# Patient Record
Sex: Male | Born: 1980 | Race: White | Hispanic: No | Marital: Married | Smoking: Former smoker
Health system: Southern US, Community
[De-identification: ages and names within clinical notes are randomized; demographics above are authoritative.]

## PROBLEM LIST (undated history)

## (undated) DIAGNOSIS — Z789 Other specified health status: Secondary | ICD-10-CM

## (undated) HISTORY — PX: HERNIA REPAIR: SHX51

---

## 2000-12-30 ENCOUNTER — Emergency Department (HOSPITAL_COMMUNITY): Admission: EM | Admit: 2000-12-30 | Discharge: 2000-12-30 | Payer: Self-pay | Admitting: Emergency Medicine

## 2001-03-31 ENCOUNTER — Emergency Department (HOSPITAL_COMMUNITY): Admission: EM | Admit: 2001-03-31 | Discharge: 2001-03-31 | Payer: Self-pay | Admitting: *Deleted

## 2001-03-31 ENCOUNTER — Encounter: Payer: Self-pay | Admitting: *Deleted

## 2002-01-10 ENCOUNTER — Emergency Department (HOSPITAL_COMMUNITY): Admission: EM | Admit: 2002-01-10 | Discharge: 2002-01-10 | Payer: Self-pay | Admitting: Emergency Medicine

## 2003-01-19 ENCOUNTER — Emergency Department (HOSPITAL_COMMUNITY): Admission: EM | Admit: 2003-01-19 | Discharge: 2003-01-19 | Payer: Self-pay | Admitting: *Deleted

## 2004-01-02 ENCOUNTER — Emergency Department (HOSPITAL_COMMUNITY): Admission: EM | Admit: 2004-01-02 | Discharge: 2004-01-02 | Payer: Self-pay | Admitting: Emergency Medicine

## 2004-01-31 ENCOUNTER — Emergency Department (HOSPITAL_COMMUNITY): Admission: EM | Admit: 2004-01-31 | Discharge: 2004-01-31 | Payer: Self-pay | Admitting: Emergency Medicine

## 2004-03-09 ENCOUNTER — Emergency Department (HOSPITAL_COMMUNITY): Admission: EM | Admit: 2004-03-09 | Discharge: 2004-03-09 | Payer: Self-pay | Admitting: Emergency Medicine

## 2004-11-07 ENCOUNTER — Emergency Department (HOSPITAL_COMMUNITY): Admission: EM | Admit: 2004-11-07 | Discharge: 2004-11-07 | Payer: Self-pay | Admitting: Emergency Medicine

## 2006-06-16 ENCOUNTER — Emergency Department (HOSPITAL_COMMUNITY): Admission: EM | Admit: 2006-06-16 | Discharge: 2006-06-16 | Payer: Self-pay | Admitting: Emergency Medicine

## 2007-03-05 ENCOUNTER — Emergency Department (HOSPITAL_COMMUNITY): Admission: EM | Admit: 2007-03-05 | Discharge: 2007-03-05 | Payer: Self-pay | Admitting: Emergency Medicine

## 2008-01-05 ENCOUNTER — Emergency Department (HOSPITAL_COMMUNITY): Admission: EM | Admit: 2008-01-05 | Discharge: 2008-01-05 | Payer: Self-pay | Admitting: Emergency Medicine

## 2008-03-02 ENCOUNTER — Emergency Department (HOSPITAL_COMMUNITY): Admission: EM | Admit: 2008-03-02 | Discharge: 2008-03-02 | Payer: Self-pay | Admitting: Emergency Medicine

## 2008-03-27 ENCOUNTER — Emergency Department (HOSPITAL_COMMUNITY): Admission: EM | Admit: 2008-03-27 | Discharge: 2008-03-27 | Payer: Self-pay | Admitting: Emergency Medicine

## 2008-05-31 ENCOUNTER — Emergency Department (HOSPITAL_COMMUNITY): Admission: EM | Admit: 2008-05-31 | Discharge: 2008-05-31 | Payer: Self-pay | Admitting: Emergency Medicine

## 2008-06-21 ENCOUNTER — Emergency Department (HOSPITAL_COMMUNITY): Admission: EM | Admit: 2008-06-21 | Discharge: 2008-06-21 | Payer: Self-pay | Admitting: Emergency Medicine

## 2008-09-12 ENCOUNTER — Emergency Department (HOSPITAL_COMMUNITY): Admission: EM | Admit: 2008-09-12 | Discharge: 2008-09-12 | Payer: Self-pay | Admitting: Emergency Medicine

## 2008-10-27 ENCOUNTER — Emergency Department (HOSPITAL_COMMUNITY): Admission: EM | Admit: 2008-10-27 | Discharge: 2008-10-27 | Payer: Self-pay | Admitting: Psychiatry

## 2008-11-22 ENCOUNTER — Emergency Department (HOSPITAL_COMMUNITY): Admission: EM | Admit: 2008-11-22 | Discharge: 2008-11-22 | Payer: Self-pay | Admitting: Emergency Medicine

## 2008-12-03 ENCOUNTER — Emergency Department (HOSPITAL_COMMUNITY): Admission: EM | Admit: 2008-12-03 | Discharge: 2008-12-03 | Payer: Self-pay | Admitting: Emergency Medicine

## 2009-02-02 ENCOUNTER — Emergency Department (HOSPITAL_COMMUNITY): Admission: EM | Admit: 2009-02-02 | Discharge: 2009-02-02 | Payer: Self-pay | Admitting: Emergency Medicine

## 2009-03-13 ENCOUNTER — Emergency Department (HOSPITAL_COMMUNITY): Admission: EM | Admit: 2009-03-13 | Discharge: 2009-03-13 | Payer: Self-pay | Admitting: Emergency Medicine

## 2009-04-18 ENCOUNTER — Emergency Department (HOSPITAL_COMMUNITY): Admission: EM | Admit: 2009-04-18 | Discharge: 2009-04-18 | Payer: Self-pay | Admitting: Emergency Medicine

## 2009-07-07 ENCOUNTER — Emergency Department (HOSPITAL_COMMUNITY): Admission: EM | Admit: 2009-07-07 | Discharge: 2009-07-07 | Payer: Self-pay | Admitting: Emergency Medicine

## 2009-08-30 ENCOUNTER — Emergency Department (HOSPITAL_COMMUNITY): Admission: EM | Admit: 2009-08-30 | Discharge: 2009-08-30 | Payer: Self-pay | Admitting: Emergency Medicine

## 2009-09-26 ENCOUNTER — Emergency Department (HOSPITAL_COMMUNITY): Admission: EM | Admit: 2009-09-26 | Discharge: 2009-09-26 | Payer: Self-pay | Admitting: Emergency Medicine

## 2009-11-30 ENCOUNTER — Emergency Department (HOSPITAL_COMMUNITY): Admission: EM | Admit: 2009-11-30 | Discharge: 2009-11-30 | Payer: Self-pay | Admitting: Emergency Medicine

## 2010-07-29 ENCOUNTER — Emergency Department (HOSPITAL_COMMUNITY)
Admission: EM | Admit: 2010-07-29 | Discharge: 2010-07-29 | Disposition: A | Discharge status: Home / Self Care | Payer: Self-pay | Attending: Emergency Medicine | Admitting: Emergency Medicine

## 2010-07-29 DIAGNOSIS — R220 Localized swelling, mass and lump, head: Secondary | ICD-10-CM | POA: Insufficient documentation

## 2010-07-29 DIAGNOSIS — R221 Localized swelling, mass and lump, neck: Secondary | ICD-10-CM | POA: Insufficient documentation

## 2010-07-29 DIAGNOSIS — R51 Headache: Secondary | ICD-10-CM | POA: Insufficient documentation

## 2010-07-29 DIAGNOSIS — IMO0002 Reserved for concepts with insufficient information to code with codable children: Secondary | ICD-10-CM | POA: Insufficient documentation

## 2010-07-30 ENCOUNTER — Emergency Department (HOSPITAL_COMMUNITY): Payer: Self-pay

## 2010-07-30 ENCOUNTER — Emergency Department (HOSPITAL_COMMUNITY)
Admission: EM | Admit: 2010-07-30 | Discharge: 2010-07-30 | Disposition: A | Discharge status: Home / Self Care | Payer: Self-pay | Attending: Emergency Medicine | Admitting: Emergency Medicine

## 2010-07-30 DIAGNOSIS — Y92009 Unspecified place in unspecified non-institutional (private) residence as the place of occurrence of the external cause: Secondary | ICD-10-CM | POA: Insufficient documentation

## 2010-07-30 DIAGNOSIS — S022XXA Fracture of nasal bones, initial encounter for closed fracture: Secondary | ICD-10-CM | POA: Insufficient documentation

## 2010-07-30 DIAGNOSIS — R220 Localized swelling, mass and lump, head: Secondary | ICD-10-CM | POA: Insufficient documentation

## 2010-07-30 DIAGNOSIS — F17200 Nicotine dependence, unspecified, uncomplicated: Secondary | ICD-10-CM | POA: Insufficient documentation

## 2010-11-11 ENCOUNTER — Emergency Department (HOSPITAL_COMMUNITY): Payer: Self-pay

## 2010-11-11 ENCOUNTER — Emergency Department (HOSPITAL_COMMUNITY)
Admission: EM | Admit: 2010-11-11 | Discharge: 2010-11-11 | Disposition: A | Discharge status: Home / Self Care | Payer: Self-pay | Attending: Emergency Medicine | Admitting: Emergency Medicine

## 2010-11-11 ENCOUNTER — Encounter: Payer: Self-pay | Admitting: *Deleted

## 2010-11-11 DIAGNOSIS — S6000XA Contusion of unspecified finger without damage to nail, initial encounter: Secondary | ICD-10-CM | POA: Insufficient documentation

## 2010-11-11 DIAGNOSIS — S60219A Contusion of unspecified wrist, initial encounter: Secondary | ICD-10-CM | POA: Insufficient documentation

## 2010-11-11 DIAGNOSIS — F17200 Nicotine dependence, unspecified, uncomplicated: Secondary | ICD-10-CM | POA: Insufficient documentation

## 2010-11-11 DIAGNOSIS — M79609 Pain in unspecified limb: Secondary | ICD-10-CM | POA: Insufficient documentation

## 2010-11-11 MED ORDER — HYDROCODONE-ACETAMINOPHEN 5-325 MG PO TABS: ORAL_TABLET | ORAL | Status: DC

## 2010-11-11 NOTE — ED Notes (Signed)
Pt states was working on car and was using 10 ton press to push out car part and wheel barring fell on right hand/wrist.  C/o increased pain/swelling.  Minimal swelling noted in triage.  Right radial pulse present, CMS intact in triage.  ROM limited due to pain.

## 2010-11-11 NOTE — ED Provider Notes (Signed)
History     Chief Complaint  Patient presents with  . Hand Pain   HPI Comments: Injured hand while working on a car.  Patient is a 30 y.o. male presenting with hand pain. The history is provided by the patient. No language interpreter was used.  Hand Pain This is a new (3 days ago) problem. The current episode started today. The problem occurs constantly. The problem has been gradually worsening. Associated symptoms include joint swelling. The symptoms are aggravated by bending. He has tried nothing for the symptoms. The treatment provided no relief.    History reviewed. No pertinent past medical history.  Past Surgical History  Procedure Date  . Hernia repair     History reviewed. No pertinent family history.  History  Substance Use Topics  . Smoking status: Current Everyday Smoker  . Smokeless tobacco: Not on file  . Alcohol Use: No      Review of Systems  Musculoskeletal: Positive for joint swelling.       Dorsal wrist pain and R 3rd finger  Pain swelling.    Physical Exam  BP 112/84  Pulse 83  Temp(Src) 98.3 F (36.8 C) (Oral)  Resp 20  Ht 5' 7.5" (1.715 m)  Wt 190 lb (86.183 kg)  BMI 29.32 kg/m2  SpO2 100%  Physical Exam  Nursing note and vitals reviewed. Constitutional: He is oriented to person, place, and time. Vital signs are normal. He appears well-developed and well-nourished. No distress.  HENT:  Head: Normocephalic and atraumatic.  Right Ear: External ear normal.  Left Ear: External ear normal.  Nose: Nose normal.  Mouth/Throat: No oropharyngeal exudate.  Eyes: Conjunctivae and EOM are normal. Pupils are equal, round, and reactive to light. Right eye exhibits no discharge. Left eye exhibits no discharge. No scleral icterus.  Neck: Normal range of motion. Neck supple. No JVD present. No tracheal deviation present. No thyromegaly present.  Cardiovascular: Normal rate, regular rhythm, normal heart sounds, intact distal pulses and normal pulses.   Exam reveals no gallop and no friction rub.   No murmur heard. Pulmonary/Chest: Effort normal and breath sounds normal. No stridor. No respiratory distress. He has no wheezes. He has no rales. He exhibits no tenderness.  Abdominal: Soft. Normal appearance and bowel sounds are normal. He exhibits no distension and no mass. There is no tenderness. There is no rebound and no guarding.  Musculoskeletal: He exhibits tenderness. He exhibits no edema.       Right shoulder: He exhibits decreased range of motion, tenderness, bony tenderness, swelling and pain. He exhibits no crepitus, no deformity, no laceration, no spasm, normal pulse and normal strength.       Arms: Lymphadenopathy:    He has no cervical adenopathy.  Neurological: He is alert and oriented to person, place, and time. He has normal reflexes. No cranial nerve deficit. Coordination normal. GCS eye subscore is 4. GCS verbal subscore is 5. GCS motor subscore is 6.  Reflex Scores:      Tricep reflexes are 2+ on the right side and 2+ on the left side.      Bicep reflexes are 2+ on the right side and 2+ on the left side.      Brachioradialis reflexes are 2+ on the right side and 2+ on the left side.      Patellar reflexes are 2+ on the right side and 2+ on the left side.      Achilles reflexes are 2+ on the right side and 2+  on the left side. Skin: Skin is warm and dry. No rash noted. He is not diaphoretic.  Psychiatric: He has a normal mood and affect. His speech is normal and behavior is normal. Judgment and thought content normal. Cognition and memory are normal.    ED Course  Procedures  MDM       Worthy Rancher, PA 11/11/10 1940  Worthy Rancher, PA 12/27/10 1610  Worthy Rancher, PA 12/27/10 [id number]  Worthy Rancher, PA 02/12/11 1315

## 2010-12-18 NOTE — ED Provider Notes (Signed)
History     CSN: [id number] Arrival date & time: 11/11/2010  4:54 PM  Chief Complaint  Patient presents with  . Hand Pain   HPI  History reviewed. No pertinent past medical history.  Past Surgical History  Procedure Date  . Hernia repair     History reviewed. No pertinent family history.  History  Substance Use Topics  . Smoking status: Current Everyday Smoker  . Smokeless tobacco: Not on file  . Alcohol Use: No      Review of Systems  Physical Exam  BP 112/84  Pulse 83  Temp(Src) 98.3 F (36.8 C) (Oral)  Resp 20  Ht 5' 7.5" (1.715 m)  Wt 190 lb (86.183 kg)  BMI 29.32 kg/m2  SpO2 100%  Physical Exam  ED Course  Procedures  MDM     Medical screening examination/treatment/procedure(s) were performed by non-physician practitioner and as supervising physician I was immediately available for consultation/collaboration.  Donnetta Hutching, MD 12/18/10 [phone]

## 2011-01-05 ENCOUNTER — Emergency Department (HOSPITAL_COMMUNITY): Payer: Self-pay

## 2011-01-05 ENCOUNTER — Emergency Department (HOSPITAL_COMMUNITY)
Admission: EM | Admit: 2011-01-05 | Discharge: 2011-01-05 | Disposition: A | Discharge status: Home / Self Care | Payer: Self-pay | Attending: Emergency Medicine | Admitting: Emergency Medicine

## 2011-01-05 ENCOUNTER — Encounter (HOSPITAL_COMMUNITY): Payer: Self-pay | Admitting: *Deleted

## 2011-01-05 DIAGNOSIS — F17200 Nicotine dependence, unspecified, uncomplicated: Secondary | ICD-10-CM | POA: Insufficient documentation

## 2011-01-05 DIAGNOSIS — IMO0002 Reserved for concepts with insufficient information to code with codable children: Secondary | ICD-10-CM | POA: Insufficient documentation

## 2011-01-05 DIAGNOSIS — M25449 Effusion, unspecified hand: Secondary | ICD-10-CM | POA: Insufficient documentation

## 2011-01-05 DIAGNOSIS — S60229A Contusion of unspecified hand, initial encounter: Secondary | ICD-10-CM | POA: Insufficient documentation

## 2011-01-05 DIAGNOSIS — Y9269 Other specified industrial and construction area as the place of occurrence of the external cause: Secondary | ICD-10-CM | POA: Insufficient documentation

## 2011-01-05 MED ORDER — IBUPROFEN 800 MG PO TABS
800.0000 mg | ORAL_TABLET | Freq: Once | ORAL | Status: AC
  Administered 2011-01-05: 800 mg via ORAL
  Filled 2011-01-05: qty 1

## 2011-01-05 MED ORDER — HYDROCODONE-ACETAMINOPHEN 5-325 MG PO TABS: ORAL_TABLET | ORAL | Status: DC

## 2011-01-05 MED ORDER — HYDROCODONE-ACETAMINOPHEN 5-325 MG PO TABS
1.0000 | ORAL_TABLET | Freq: Once | ORAL | Status: AC
  Administered 2011-01-05: 1 via ORAL
  Filled 2011-01-05: qty 1
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
## 2011-01-05 NOTE — ED Notes (Signed)
C/o pain in left hand, states a motor fell on hand today, states he took ibuprofen without relief

## 2011-01-05 NOTE — ED Provider Notes (Addendum)
History     CSN: [id number] Arrival date & time: 01/05/2011  8:31 PM   Chief Complaint  Patient presents with  . Arm Injury     (Include location/radiation/quality/duration/timing/severity/associated sxs/prior treatment) HPI Comments: Putting a motor in a car and it fell on his L hand.  Weighs ~ 350 lbs.  Patient is a 30 y.o. male presenting with arm injury. The history is provided by the patient. No language interpreter was used.  Arm Injury  The incident occurred yesterday. The incident occurred at home. There is an injury to the right wrist and right hand. The pain is severe. There have been no prior injuries to these areas. He is right-handed. His tetanus status is UTD. He has been behaving normally. He has received no recent medical care.     History reviewed. No pertinent past medical history.   Past Surgical History  Procedure Date  . Hernia repair     History reviewed. No pertinent family history.  History  Substance Use Topics  . Smoking status: Current Everyday Smoker  . Smokeless tobacco: Not on file  . Alcohol Use: No      Review of Systems  All other systems reviewed and are negative.    Allergies  Review of patient's allergies indicates no known allergies.  Home Medications   Current Outpatient Rx  Name Route Sig Dispense Refill  . IBUPROFEN 200 MG PO TABS Oral Take 400 mg by mouth once as needed. For pain     . HYDROCODONE-ACETAMINOPHEN 5-325 MG PO TABS  One po q 4-6 hrs prn pain 20 tablet 0    Physical Exam    BP 115/78  Pulse 111  Temp(Src) 97.9 F (36.6 C) (Oral)  Resp 20  Ht 5' 7.5" (1.715 m)  Wt 191 lb (86.637 kg)  BMI 29.47 kg/m2  SpO2 96%  Physical Exam  Nursing note and vitals reviewed. Constitutional: He is oriented to person, place, and time. Vital signs are normal. He appears well-developed and well-nourished. No distress.  HENT:  Head: Normocephalic and atraumatic.  Right Ear: External ear normal.  Left Ear: External  ear normal.  Nose: Nose normal.  Mouth/Throat: No oropharyngeal exudate.  Eyes: Conjunctivae and EOM are normal. Pupils are equal, round, and reactive to light. Right eye exhibits no discharge. Left eye exhibits no discharge. No scleral icterus.  Neck: Normal range of motion. Neck supple. No JVD present. No tracheal deviation present. No thyromegaly present.  Cardiovascular: Normal rate, regular rhythm, normal heart sounds, intact distal pulses and normal pulses.  Exam reveals no gallop and no friction rub.   No murmur heard. Pulmonary/Chest: Effort normal and breath sounds normal. No stridor. No respiratory distress. He has no wheezes. He has no rales. He exhibits no tenderness.  Abdominal: Soft. Normal appearance and bowel sounds are normal. He exhibits no distension and no mass. There is no tenderness. There is no rebound and no guarding.  Musculoskeletal: He exhibits tenderness. He exhibits no edema.       Left hand: He exhibits decreased range of motion, tenderness, bony tenderness and swelling. He exhibits normal two-point discrimination, normal capillary refill, no deformity and no laceration. normal sensation noted. Normal strength noted.       Hands: Lymphadenopathy:    He has no cervical adenopathy.  Neurological: He is alert and oriented to person, place, and time. He has normal reflexes. No cranial nerve deficit. Coordination normal. GCS eye subscore is 4. GCS verbal subscore is 5. GCS motor subscore  is 6.  Skin: Skin is warm and dry. No rash noted. He is not diaphoretic.  Psychiatric: He has a normal mood and affect. His speech is normal and behavior is normal. Judgment and thought content normal. Cognition and memory are normal.    ED Course  Procedures  No results found for this or any previous visit. Dg Wrist Complete Left  01/05/2011  *RADIOLOGY REPORT*  Clinical Data: Blunt trauma to left posterior hand  LEFT WRIST - COMPLETE 3+ VIEW  Comparison: None.  Findings: No evidence  of acute fracture or dislocation.  The joint spaces are preserved.  The visualized soft tissues are unremarkable.  Old ulnar styloid fracture.  IMPRESSION: No evidence of acute fracture or dislocation.  Original Report Authenticated By: Charline Bills, M.D.   Dg Hand Complete Left  01/05/2011  *RADIOLOGY REPORT*  Clinical Data: Blunt trauma to the left posterior hand  LEFT HAND - COMPLETE 3+ VIEW  Comparison: None.  Findings: No evidence of acute fracture or dislocation.  The joint spaces are preserved.  Old ulnar styloid fracture.  The visualized soft tissues are unremarkable.  IMPRESSION: No fracture or dislocation is seen.  Original Report Authenticated By: Charline Bills, M.D.     No diagnosis found.   MDM HPI should read injury to left hand and wrist.       Worthy Rancher, PA 01/05/11 2248  Worthy Rancher, PA 03/07/11 [phone]

## 2011-01-05 NOTE — ED Notes (Signed)
Lenora Boys wrap placed to left hand. Pt reports pain relieved after placement.

## 2011-01-06 NOTE — ED Provider Notes (Signed)
Medical screening examination/treatment/procedure(s) were performed by non-physician practitioner and as supervising physician I was immediately available for consultation/collaboration.  Legend Pecore, MD 01/06/11 0010 

## 2011-01-23 LAB — COMPREHENSIVE METABOLIC PANEL
AST: 21
CO2: 27
Calcium: 9.1
Creatinine, Ser: 0.63
GFR calc Af Amer: >60
GFR calc non Af Amer: >60
Total Protein: 6.1

## 2011-01-23 LAB — DIFFERENTIAL
Basophils Relative: 2 — ABNORMAL HIGH
Monocytes Absolute: 0.7
Neutrophils Relative %: 66

## 2011-01-23 LAB — URINALYSIS, ROUTINE W REFLEX MICROSCOPIC
Bilirubin Urine: NEGATIVE
Nitrite: NEGATIVE
Specific Gravity, Urine: 1.01
Urobilinogen, UA: 0.2

## 2011-01-23 LAB — CBC
MCHC: 34.3
MCV: 98.5
RBC: 4.3

## 2011-03-05 ENCOUNTER — Emergency Department (HOSPITAL_COMMUNITY)
Admission: EM | Admit: 2011-03-05 | Discharge: 2011-03-05 | Discharge status: Against Medical Advice | Payer: Self-pay | Attending: Emergency Medicine | Admitting: Emergency Medicine

## 2011-03-05 ENCOUNTER — Encounter (HOSPITAL_COMMUNITY): Payer: Self-pay | Admitting: Emergency Medicine

## 2011-03-05 DIAGNOSIS — S0510XA Contusion of eyeball and orbital tissues, unspecified eye, initial encounter: Secondary | ICD-10-CM | POA: Insufficient documentation

## 2011-03-05 DIAGNOSIS — S01309A Unspecified open wound of unspecified ear, initial encounter: Secondary | ICD-10-CM | POA: Insufficient documentation

## 2011-03-05 DIAGNOSIS — S0990XA Unspecified injury of head, initial encounter: Secondary | ICD-10-CM | POA: Insufficient documentation

## 2011-03-05 DIAGNOSIS — Z5320 Procedure and treatment not carried out because of patient's decision for unspecified reasons: Secondary | ICD-10-CM | POA: Insufficient documentation
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
## 2011-03-09 NOTE — ED Provider Notes (Signed)
Medical screening examination/treatment/procedure(s) were performed by non-physician practitioner and as supervising physician I was immediately available for consultation/collaboration.  Hiba Garry, MD 03/09/11 1356 

## 2011-03-17 ENCOUNTER — Emergency Department (HOSPITAL_COMMUNITY): Payer: Self-pay

## 2011-03-17 ENCOUNTER — Encounter (HOSPITAL_COMMUNITY): Payer: Self-pay | Admitting: *Deleted

## 2011-03-17 ENCOUNTER — Emergency Department (HOSPITAL_COMMUNITY)
Admission: EM | Admit: 2011-03-17 | Discharge: 2011-03-17 | Disposition: A | Discharge status: Home / Self Care | Payer: Self-pay | Attending: Emergency Medicine | Admitting: Emergency Medicine

## 2011-03-17 DIAGNOSIS — F17200 Nicotine dependence, unspecified, uncomplicated: Secondary | ICD-10-CM | POA: Insufficient documentation

## 2011-03-17 DIAGNOSIS — Y92009 Unspecified place in unspecified non-institutional (private) residence as the place of occurrence of the external cause: Secondary | ICD-10-CM | POA: Insufficient documentation

## 2011-03-17 DIAGNOSIS — S022XXA Fracture of nasal bones, initial encounter for closed fracture: Secondary | ICD-10-CM | POA: Insufficient documentation

## 2011-03-17 MED ORDER — OXYCODONE-ACETAMINOPHEN 5-325 MG PO TABS: 1.0000 | ORAL_TABLET | ORAL | Status: AC | PRN

## 2011-03-17 MED ORDER — OXYCODONE-ACETAMINOPHEN 5-325 MG PO TABS
1.0000 | ORAL_TABLET | Freq: Once | ORAL | Status: AC
  Administered 2011-03-17: 1 via ORAL
  Filled 2011-03-17: qty 1

## 2011-03-17 NOTE — ED Provider Notes (Signed)
Medical screening examination/treatment/procedure(s) were performed by non-physician practitioner and as supervising physician I was immediately available for consultation/collaboration.   Ky Moskowitz R. Justn Quale, MD 03/17/11 2212 
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
## 2011-03-17 NOTE — ED Notes (Signed)
Pt a/ox4. Resp even and unlabored. NAD at this time. D/C instructions and Rx reviewed with pt. Pt verbalized understanding. Pt ambulated to lobby with steady gate.  
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
## 2011-04-24 ENCOUNTER — Encounter (HOSPITAL_COMMUNITY): Payer: Self-pay | Admitting: *Deleted

## 2011-04-24 ENCOUNTER — Emergency Department (HOSPITAL_COMMUNITY)
Admission: EM | Admit: 2011-04-24 | Discharge: 2011-04-24 | Disposition: A | Discharge status: Home / Self Care | Payer: Self-pay | Attending: Emergency Medicine | Admitting: Emergency Medicine

## 2011-04-24 ENCOUNTER — Emergency Department (HOSPITAL_COMMUNITY): Payer: Self-pay

## 2011-04-24 DIAGNOSIS — F17200 Nicotine dependence, unspecified, uncomplicated: Secondary | ICD-10-CM | POA: Insufficient documentation

## 2011-04-24 DIAGNOSIS — R609 Edema, unspecified: Secondary | ICD-10-CM | POA: Insufficient documentation

## 2011-04-24 DIAGNOSIS — M25539 Pain in unspecified wrist: Secondary | ICD-10-CM | POA: Insufficient documentation

## 2011-04-24 DIAGNOSIS — S60229A Contusion of unspecified hand, initial encounter: Secondary | ICD-10-CM | POA: Insufficient documentation

## 2011-04-24 DIAGNOSIS — M79609 Pain in unspecified limb: Secondary | ICD-10-CM | POA: Insufficient documentation

## 2011-04-24 DIAGNOSIS — W208XXA Other cause of strike by thrown, projected or falling object, initial encounter: Secondary | ICD-10-CM | POA: Insufficient documentation

## 2011-04-24 DIAGNOSIS — S60221A Contusion of right hand, initial encounter: Secondary | ICD-10-CM

## 2011-04-24 MED ORDER — HYDROCODONE-ACETAMINOPHEN 5-325 MG PO TABS
1.0000 | ORAL_TABLET | Freq: Once | ORAL | Status: AC
  Administered 2011-04-24: 1 via ORAL
  Filled 2011-04-24: qty 1

## 2011-04-24 MED ORDER — HYDROCODONE-ACETAMINOPHEN 5-325 MG PO TABS: ORAL_TABLET | ORAL | Status: AC

## 2011-04-24 MED ORDER — IBUPROFEN 800 MG PO TABS: 800.0000 mg | ORAL_TABLET | Freq: Three times a day (TID) | ORAL | Status: AC

## 2011-04-24 MED ORDER — IBUPROFEN 800 MG PO TABS
800.0000 mg | ORAL_TABLET | Freq: Once | ORAL | Status: AC
  Administered 2011-04-24: 800 mg via ORAL
  Filled 2011-04-24: qty 1

## 2011-04-24 NOTE — ED Provider Notes (Signed)
History     CSN: [id number]  Arrival date & time 04/24/11  1433   First MD Initiated Contact with Patient 04/24/11 1545      Chief Complaint  Patient presents with  . Wrist Pain    (Consider location/radiation/quality/duration/timing/severity/associated sxs/prior treatment) HPI Comments: Patient c/o pain and swelling to his right hand since yesterday.  States he was working on a car and the transmission fell on his right hand, pinning his hand underneath.  Pain extends from the proximal wrist to the tips of the fourth and fifth fingers.  Applied ice yesterday.  He denies discoloration of the hand or fingers, numbness or or other injuries.    Patient is a 31 y.o. male presenting with wrist pain. The history is provided by the patient.  Wrist Pain This is a new problem. The current episode started yesterday. The problem occurs constantly. The problem has been unchanged. Associated symptoms include arthralgias and joint swelling. Pertinent negatives include no fever, neck pain, numbness, vomiting or weakness. Exacerbated by: use, movement and palpation. He has tried ice for the symptoms. The treatment provided mild relief.    History reviewed. No pertinent past medical history.  Past Surgical History  Procedure Date  . Hernia repair     No family history on file.  History  Substance Use Topics  . Smoking status: Current Everyday Smoker -- 2.0 packs/day    Types: Cigarettes  . Smokeless tobacco: Not on file  . Alcohol Use: Yes     occasional      Review of Systems  Constitutional: Negative for fever.  HENT: Negative for neck pain.   Gastrointestinal: Negative for vomiting.  Musculoskeletal: Positive for joint swelling and arthralgias.  Skin: Negative.  Negative for color change and wound.  Neurological: Negative for weakness and numbness.  All other systems reviewed and are negative.    Allergies  Review of patient's allergies indicates no known allergies.  Home  Medications   Current Outpatient Rx  Name Route Sig Dispense Refill  . NAPROXEN SODIUM 220 MG PO TABS Oral Take 440 mg by mouth once as needed. For pain       BP 121/87  Pulse 118  Temp(Src) 98 F (36.7 C) (Oral)  Resp 20  Ht 5\' 7"  (1.702 m)  Wt 190 lb (86.183 kg)  BMI 29.76 kg/m2  SpO2 99%  Physical Exam  Nursing note and vitals reviewed. Constitutional: He is oriented to person, place, and time. He appears well-developed and well-nourished. No distress.  HENT:  Head: Normocephalic and atraumatic.  Cardiovascular: Normal rate, regular rhythm and normal heart sounds.   Pulmonary/Chest: Effort normal and breath sounds normal. No respiratory distress. He exhibits no tenderness.  Musculoskeletal: Normal range of motion. He exhibits edema and tenderness.       Hands: Neurological: He is alert and oriented to person, place, and time. He exhibits normal muscle tone. Coordination normal.  Skin: Skin is warm and dry.    ED Course  SPLINT APPLICATION Date/Time: 04/24/2011 5:09 PM Performed by: Trisha Mangle, Leomia Blake L. Authorized by: Maxwell Caul Consent: Verbal consent obtained. Written consent not obtained. Consent given by: patient Patient understanding: patient states understanding of the procedure being performed Patient consent: the patient's understanding of the procedure matches consent given Imaging studies: imaging studies available Patient identity confirmed: verbally with patient Location details: right hand Splint type: velcro forearm splint. Post-procedure: The splinted body part was neurovascularly unchanged following the procedure. Patient tolerance: Patient tolerated the procedure well with  no immediate complications.   (including critical care time)  Dg Wrist Complete Right  04/24/2011  *RADIOLOGY REPORT*  Clinical Data: Crushing injury of the hand and wrist.  Pain, tenderness.  RIGHT WRIST - COMPLETE 3+ VIEW  Comparison: 11/11/2010  Findings: There is no  evidence for acute fracture or dislocation. No soft tissue foreign body or gas identified.  Intercarpal spaces are normal.  IMPRESSION: Negative exam.  Original Report Authenticated By: Patterson Hammersmith, M.D.   Dg Hand Complete Right  04/24/2011  *RADIOLOGY REPORT*  Clinical Data: Pain and swelling.  Crush injury of the hand and wrist.  RIGHT HAND - COMPLETE 3+ VIEW  Comparison: 11/11/2010  Findings: There is no evidence for acute fracture or dislocation. No soft tissue foreign body or gas identified.  IMPRESSION: Negative exam.  Original Report Authenticated By: Patterson Hammersmith, M.D.        MDM    ttp of the dorsal/lateral right hand.  Mild to moderate STS also present.  No abrasions, bruising or lacerations.  CR< 2 sec, radial pulse is brisk and sensation intact.  Will place him in velcro forearm splint and he agrees to follow-up with Dr. Romeo Apple.          Levada Bowersox L. Taejah Ohalloran, [place] 04/24/11 1718
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
## 2011-04-25 NOTE — ED Provider Notes (Signed)
Medical screening examination/treatment/procedure(s) were performed by non-physician practitioner and as supervising physician I was immediately available for consultation/collaboration.  Nicoletta Dress. Colon Branch, MD 04/25/11 1352

## 2011-05-26 NOTE — ED Provider Notes (Signed)
Medical screening examination/treatment/procedure(s) were performed by non-physician practitioner and as supervising physician I was immediately available for consultation/collaboration.  Donnetta Hutching, MD 05/26/11 2208

## 2011-06-13 ENCOUNTER — Encounter (HOSPITAL_COMMUNITY): Payer: Self-pay | Admitting: Emergency Medicine

## 2011-06-13 ENCOUNTER — Emergency Department (HOSPITAL_COMMUNITY)
Admission: EM | Admit: 2011-06-13 | Discharge: 2011-06-13 | Disposition: A | Discharge status: Home / Self Care | Payer: Self-pay | Attending: Emergency Medicine | Admitting: Emergency Medicine

## 2011-06-13 DIAGNOSIS — R569 Unspecified convulsions: Secondary | ICD-10-CM | POA: Insufficient documentation

## 2011-06-13 DIAGNOSIS — F17200 Nicotine dependence, unspecified, uncomplicated: Secondary | ICD-10-CM | POA: Insufficient documentation

## 2011-06-13 DIAGNOSIS — R55 Syncope and collapse: Secondary | ICD-10-CM | POA: Insufficient documentation

## 2011-06-13 MED ORDER — ZOLPIDEM TARTRATE 5 MG PO TABS: 5.0000 mg | ORAL_TABLET | Freq: Every evening | ORAL | Status: DC | PRN
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
## 2011-06-13 NOTE — Discharge Instructions (Signed)
Nonepileptic Seizures Nonepileptic seizures look like true epileptic seizures. The difference between nonepileptic seizures and real seizures is that real seizures are caused by an electrical abnormality in the brain. Nonepileptic seizures have no medical cause. Nonepileptic seizures may look real to an untrained person. A neurologist can usually tell the difference between a real seizure and a nonepileptic seizure. Nonepileptic seizures may also be called pseudoseizures. They are more frequent in women. CAUSES  In general, the patient is unaware that the movements are not real seizures. This disorder is caused by stress or emotional trauma. Patients often feel badly. Patients are sometimes accused of causing the seizure-like movements when they are not aware that their symptoms are due to stress. The nonepileptic seizures are real and frightening to patients with this disorder. Sometimes, nonepileptic seizures may be due to a person faking the symptoms to get something he or she wants. DIAGNOSIS  The diagnosis requires the patient to be continuously monitored by:  EEG (electroencephalogram).   Video camera.  After an episode, the patient is asked about their awareness, memory, and feelings during the seizure. The family, if present, also discusses what they see. The EEG and clinical information allows the neurologist to determine if the seizures are related to abnormal electrical activity in the brain. TREATMENT  Medicines may be stopped if the patient has been treated for a true seizure disorder. Patient counseling is usually begun. Depression and anxiety, if present, are treated. Counseling helps to resolve stress.  Document Released: 05/25/2005 Document Revised: 12/20/2010 Document Reviewed: 10/21/2008 [employer] Patient Information 2012 [employer]. 
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
## 2011-06-13 NOTE — ED Notes (Signed)
Pain with palpation to c2-3 area. c collar applied and dr Radford Pax aware.

## 2011-06-14 ENCOUNTER — Emergency Department (HOSPITAL_COMMUNITY)
Admission: EM | Admit: 2011-06-14 | Discharge: 2011-06-14 | Disposition: A | Discharge status: Home / Self Care | Payer: Self-pay | Attending: Emergency Medicine | Admitting: Emergency Medicine

## 2011-06-14 ENCOUNTER — Emergency Department (HOSPITAL_COMMUNITY): Payer: Self-pay

## 2011-06-14 ENCOUNTER — Other Ambulatory Visit: Payer: Self-pay

## 2011-06-14 ENCOUNTER — Encounter (HOSPITAL_COMMUNITY): Payer: Self-pay | Admitting: *Deleted

## 2011-06-14 DIAGNOSIS — F17200 Nicotine dependence, unspecified, uncomplicated: Secondary | ICD-10-CM | POA: Insufficient documentation

## 2011-06-14 DIAGNOSIS — R55 Syncope and collapse: Secondary | ICD-10-CM | POA: Insufficient documentation

## 2011-06-14 DIAGNOSIS — R42 Dizziness and giddiness: Secondary | ICD-10-CM | POA: Insufficient documentation

## 2011-06-14 LAB — POCT I-STAT, CHEM 8
BUN: 9 mg/dL (ref 6–23)
Calcium, Ion: 1.16 mmol/L (ref 1.12–1.32)
Chloride: 105 mEq/L (ref 96–112)
Creatinine, Ser: 0.7 mg/dL (ref 0.50–1.35)
Sodium: 143 mEq/L (ref 135–145)

## 2011-06-14 MED ORDER — POTASSIUM CHLORIDE CRYS ER 20 MEQ PO TBCR
20.0000 meq | EXTENDED_RELEASE_TABLET | Freq: Once | ORAL | Status: AC
  Administered 2011-06-14: 20 meq via ORAL
  Filled 2011-06-14: qty 1

## 2011-06-14 NOTE — Discharge Instructions (Signed)
Please be aware you may have another seizure  Do not drive until seen by your physician for your condition  Do not climb ladders/roofs/trees as a seizure can occur at that height and cause serious harm  Do not bathe/swim alone as a seizure can occur and cause serious harm  Please followup with your physician or neurologist for further testing and possible treatment    Near-Syncope Near-syncope is sudden weakness, dizziness, or feeling like you might pass out (faint). This may occur when getting up after sitting or while standing for a long period of time. Near-syncope can be caused by a drop in blood pressure. This is a common reaction, but it may occur to a greater degree in people taking medicines to control their blood pressure. Fainting often occurs when the blood pressure or pulse is too low to provide enough blood flow to the brain to keep you conscious. Fainting and near-syncope are not usually due to serious medical problems. However, certain people should be more cautious in the event of near-syncope, including elderly patients, patients with diabetes, and patients with a history of heart conditions (especially irregular rhythms).  CAUSES   Drop in blood pressure.   Physical pain.   Dehydration.   Heat exhaustion.   Emotional distress.   Low blood sugar.   Internal bleeding.   Heart and circulatory problems.   Infections.  SYMPTOMS   Dizziness.   Feeling sick to your stomach (nauseous).   Nearly fainting.   Body numbness.   Turning pale.   Tunnel vision.   Weakness.  HOME CARE INSTRUCTIONS   Lie down right away if you start feeling like you might faint. Breathe deeply and steadily. Wait until all the symptoms have passed. Most of these episodes last only a few minutes. You may feel tired for several hours.   Drink enough fluids to keep your urine clear or pale yellow.   If you are taking blood pressure or heart medicine, get up slowly, taking several  minutes to sit and then stand. This can reduce dizziness that is caused by a drop in blood pressure.  SEEK IMMEDIATE MEDICAL CARE IF:   You have a severe headache.   Unusual pain develops in the chest, abdomen, or back.   There is bleeding from the mouth or rectum, or you have black or tarry stool.   An irregular heartbeat or a very rapid pulse develops.   You have repeated fainting or seizure-like jerking during an episode.   You faint when sitting or lying down.   You develop confusion.   You have difficulty walking.   Severe weakness develops.   Vision problems develop.  MAKE SURE YOU:   Understand these instructions.   Will watch your condition.   Will get help right away if you are not doing well or get worse.  Document Released: 04/09/2005 Document Revised: 12/20/2010 Document Reviewed: 05/26/2010 [facility] Patient Information 2012 [place], [place].

## 2011-06-14 NOTE — ED Provider Notes (Signed)
I saw and evaluated the patient, reviewed the resident's note and I agree with the findings and plan.   .Face to face Exam:  General:  Awake HEENT:  Atraumatic Resp:  Normal effort Abd:  Nondistended Neuro:No focal weakness Lymph: No adenopathy   Nelia Shi, MD 06/14/11 2307

## 2011-06-14 NOTE — ED Notes (Signed)
Pt here yesterday for syncope and does not remember what happened, here today for dizziness and feels as if he is going to pass out, denies any pain or N/V

## 2011-06-14 NOTE — ED Notes (Signed)
Admits to not eating as much recently, had 3 bites of breakfast

## 2011-06-14 NOTE — ED Notes (Signed)
Patient transported to CT 

## 2011-06-14 NOTE — ED Notes (Signed)
Denies any dizziness during orthostatic VS

## 2011-06-14 NOTE — ED Provider Notes (Signed)
History   This chart was scribed for Joya Gaskins, MD by Clarita Crane. The patient was seen in room APA05/APA05. Patient's care was started at 1106.    CSN: [id number]  Arrival date & time 06/14/11  1106   First MD Initiated Contact with Patient 06/14/11 1115      Chief Complaint  Patient presents with  . Dizziness  . Near Syncope     HPI Richard Valdez is a 31 y.o. male who presents to the Emergency Department complaining of an episode of moderate to severe near syncope which occurred this morning while working on car and persistent since with associated dizziness described as lightheadedness and his surroundings spinning. Denies blurred vision, chest pain, SOB, abdominal pain, vomiting, diarrhea. Patient notes he was evaluated in ED yesterday for an apparent syncopal episode and questionable seizure in which he sustained a head injury. Patient notes he was amnestic to events which occurred from time of syncopal episode/questionable seizure to arrival in ED yesterday afternoon. Patient denies h/o seizures.  He denies inhaling toxic fumes and was not in an enclosed space while working on car No previous h/o syncope No personal h/o syncope  PMH - none  Past Surgical History  Procedure Date  . Hernia repair     History reviewed. No pertinent family history.  History  Substance Use Topics  . Smoking status: Current Everyday Smoker -- 2.0 packs/day    Types: Cigarettes  . Smokeless tobacco: Not on file  . Alcohol Use: Yes     occasional      Review of Systems 10 Systems reviewed and are negative for acute change except as noted in the HPI.  Allergies  Review of patient's allergies indicates no known allergies.  Home Medications   Current Outpatient Rx  Name Route Sig Dispense Refill  . IBUPROFEN 200 MG PO TABS Oral Take 800 mg by mouth every 6 (six) hours as needed. Pain    . ZOLPIDEM TARTRATE 5 MG PO TABS Oral Take 5 mg by mouth at bedtime as needed.       BP 100/59  Pulse 98  Temp(Src) 98.5 F (36.9 C) (Oral)  Resp 20  Ht 5\' 8"  (1.727 m)  Wt 190 lb (86.183 kg)  BMI 28.89 kg/m2  SpO2 95%  Physical Exam CONSTITUTIONAL: Well developed/well nourished HEAD AND FACE: Normocephalic/atraumatic EYES: EOMI/PERRL ENMT: Mucous membranes moist, TMs normal bilaterally NECK: supple no meningeal signs, no carotid bruits SPINE:entire spine nontender CV: S1/S2 noted, no murmurs/rubs/gallops noted LUNGS: Lungs are clear to auscultation bilaterally, no apparent distress ABDOMEN: soft, nontender, no rebound or guarding NEURO: Pt is awake/alert, moves all extremitiesx4, Awake/alert, facies symmetric, no arm or leg drift is noted,  Gait normal, no past pointing EXTREMITIES: pulses normal, full ROM SKIN: warm, color normal PSYCH: no abnormalities of mood noted  ED Course  Procedures  DIAGNOSTIC STUDIES: Oxygen Saturation is 95% on room air, adequate by my interpretation.    COORDINATION OF CARE: 11:50AM- Patient informed of current plan for treatment and evaluation and agrees with plan at this time.  12:56PM- Patient informed of current lab and imaging results and intent to d/c home.  Discussed that there is a possibility of seizure.  Advised to avoid driving/bathing alone/work at high heights.  Advised followup.  Pt stable for d/c  The patient appears reasonably screened and/or stabilized for discharge and I doubt any other medical condition or other [facility] requiring further screening, evaluation, or treatment in the ED at this time  prior to discharge.     MDM  Nursing notes reviewed and considered in documentation Previous records reviewed and considered All labs/vitals reviewed and considered     Date: 06/14/2011  Rate: 72  Rhythm: normal sinus rhythm  QRS Axis: normal  Intervals: normal  ST/T Wave abnormalities: normal  Conduction Disutrbances:nonspecific intraventricular conduction delay  Narrative Interpretation:   Old EKG  Reviewed: unchanged     I personally performed the services described in this documentation, which was scribed in my presence. The recorded information has been reviewed and considered.      Joya Gaskins, MD 06/14/11 1304

## 2011-06-14 NOTE — ED Notes (Signed)
Denies any dizziness at this time.  

## 2011-06-27 ENCOUNTER — Emergency Department (HOSPITAL_COMMUNITY)
Admission: EM | Admit: 2011-06-27 | Discharge: 2011-06-27 | Disposition: A | Discharge status: Home / Self Care | Payer: Self-pay | Attending: Emergency Medicine | Admitting: Emergency Medicine

## 2011-06-27 ENCOUNTER — Emergency Department (HOSPITAL_COMMUNITY): Payer: Self-pay

## 2011-06-27 ENCOUNTER — Encounter (HOSPITAL_COMMUNITY): Payer: Self-pay | Admitting: *Deleted

## 2011-06-27 DIAGNOSIS — M25511 Pain in right shoulder: Secondary | ICD-10-CM

## 2011-06-27 DIAGNOSIS — M25519 Pain in unspecified shoulder: Secondary | ICD-10-CM | POA: Insufficient documentation

## 2011-06-27 DIAGNOSIS — Z9181 History of falling: Secondary | ICD-10-CM | POA: Insufficient documentation

## 2011-06-27 DIAGNOSIS — F17200 Nicotine dependence, unspecified, uncomplicated: Secondary | ICD-10-CM | POA: Insufficient documentation

## 2011-06-27 DIAGNOSIS — Z87828 Personal history of other (healed) physical injury and trauma: Secondary | ICD-10-CM | POA: Insufficient documentation

## 2011-06-27 MED ORDER — OXYCODONE-ACETAMINOPHEN 5-325 MG PO TABS: 1.0000 | ORAL_TABLET | ORAL | Status: AC | PRN

## 2011-06-27 MED ORDER — OXYCODONE-ACETAMINOPHEN 5-325 MG PO TABS
1.0000 | ORAL_TABLET | Freq: Once | ORAL | Status: AC
  Administered 2011-06-27: 1 via ORAL
  Filled 2011-06-27: qty 1

## 2011-06-27 NOTE — ED Provider Notes (Cosign Needed)
History     CSN: [id number]  Arrival date & time 06/27/11  0401   First MD Initiated Contact with Patient 06/27/11 [phone]      Chief Complaint  Patient presents with  . Shoulder Pain    (Consider location/radiation/quality/duration/timing/severity/associated sxs/prior treatment) HPI Comments: Richard Valdez is a 31 y.o. Male who presents with shoulder pain that kept him up tonight so he came here to be evaluated. He reports having right shoulder pain that is recurrent from an old injury, since he fell several times in the last 2 weeks. The recent falls have not been associated with loss of consciousness. He was evaluated in the emergency department setting twice and referred to neurology. He has not seen that doctor yet. He had possible seizures, so was given seizure precautions . He has tried over-the-counter medicines for the right shoulder pain, but has not helped. The pain is worse with movement. He occasionally has had pain like this over the last 2 years.  The history is provided by the patient.    History reviewed. No pertinent past medical history.  Past Surgical History  Procedure Date  . Hernia repair     History reviewed. No pertinent family history.  History  Substance Use Topics  . Smoking status: Current Everyday Smoker -- 2.0 packs/day    Types: Cigarettes  . Smokeless tobacco: Not on file  . Alcohol Use: Yes     occasional      Review of Systems  All other systems reviewed and are negative.    Allergies  Review of patient's allergies indicates no known allergies.  Home Medications   Current Outpatient Rx  Name Route Sig Dispense Refill  . IBUPROFEN 200 MG PO TABS Oral Take 800 mg by mouth every 6 (six) hours as needed. Pain    . ZOLPIDEM TARTRATE 5 MG PO TABS Oral Take 5 mg by mouth at bedtime as needed.      BP 117/78  Pulse 87  Temp 97.7 F (36.5 C)  Resp 20  Ht 5\' 8"  (1.727 m)  Wt 174 lb (78.926 kg)  BMI 26.46 kg/m2  SpO2  99%  Physical Exam  Nursing note and vitals reviewed. Constitutional: He is oriented to person, place, and time. He appears well-developed and well-nourished.  HENT:  Head: Normocephalic and atraumatic.  Right Ear: External ear normal.  Left Ear: External ear normal.  Eyes: Conjunctivae and EOM are normal. Pupils are equal, round, and reactive to light.  Neck: Normal range of motion and phonation normal. Neck supple.  Pulmonary/Chest: Effort normal. He exhibits no bony tenderness.  Abdominal: Normal appearance.  Musculoskeletal:       Right shoulder, diffusely tender without patient, but reasonably good range of motion and strength of rotator cuff.  Neurological: He is alert and oriented to person, place, and time. He has normal strength. No cranial nerve deficit or sensory deficit. He exhibits normal muscle tone. Coordination normal.       Normal gait  Skin: Skin is warm, dry and intact.  Psychiatric: He has a normal mood and affect. His behavior is normal. Judgment and thought content normal.    ED Course  Procedures (including critical care time) Emergency department treatment: Percocet, by mouth    MDM  Shoulder pain after falls, likely contusion. Doubt fracture. Patient stable for discharge. He has previously been referred to neurology, but not done that yet for possible seizures. This occurred after evaluations in the emergency department  2/20 and 06/14/2011.  Plan: Home Medications- Percocet (#10); Home Treatments- heat to shoulder; Recommended follow up- PCP of choice for shoulder, prn. See Neurology to be evaluated for seizures.       Flint Melter, MD 06/27/11 [phone]

## 2011-06-27 NOTE — ED Notes (Addendum)
Pt reports pain in right shoulder has increased tonight.  States that he had seizures about a week ago and when he fell he hurt his shoulder.  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  Reports no relief from Advil taken at home earlier.

## 2011-06-27 NOTE — Discharge Instructions (Signed)
Use heat on the sore areas 3-4 times a day. Do not drive or work within 6 hours of taking the narcotic pain pill. See a neurologist as soon as possible to be evaluated for seizures. See her medical doctor if her shoulder is not better in one week.  RESOURCE GUIDE  Dental Problems  Patients with Medicaid: [facility] [phone] [address]                                           [phone] [address] Phone:  [phone]                                                  Phone:  [phone]  If unable to pay or uninsured, contact:  [facility] or [facility]. to become qualified for the adult dental clinic.  Chronic Pain Problems Contact [facility]  [phone] Patients need to be referred by their primary care doctor.  Insufficient Money for Medicine Contact [employer]:  call "211" or [facility] 706-535-3782.  No Primary Care Doctor Call [facility]  [phone] Other agencies that provide inexpensive medical care    [facility]  [phone]    [facility]  [phone]    [facility]  [phone]    [facility]  [phone]    [facility]  [phone]    [facility]  [phone]  Psychological Services [facility]  [phone] [facility] Services  [phone] [facility]   [phone] (emergency services [phone])  Substance Abuse Resources [facility]  629-082-8691 [facility] [phone] [facility] [phone] Floydene Flock 939-304-1535 Residential & Outpatient Substance Abuse Program  [phone]  Abuse/Neglect [facility] Child Abuse Hotline 862-540-6862 [facility] Child Abuse Hotline 419-783-6057 (After Hours)  Emergency Shelter [facility] Minist[facility] 415-267-5919  Maternity Homes [facility] [place] [facility] 518-698-9424 [facility] [phone]  MRSA Hotline #:   504-518-1263    [facility] Resources  [facility]     [employer]                          [facility] Dept. [address] [place]                       [address]      [address]  [place]                                                Cristobal Goldmann Phone:  [phone]  Phone:  [phone]                 Phone:  [phone]  [facility] Phone:  [phone]  [facility] Child Abuse Hotline 920-607-8714 (407)604-7151 (After Hours) Shoulder Pain The shoulder is a ball and socket joint. The muscles and tendons (rotator cuff) are what keep the shoulder in its joint and stable. This collection of muscles and tendons holds in the head (ball) of the humerus (upper arm bone) in the fossa (cup) of the scapula (shoulder blade). Today no reason was found for your shoulder pain. Often pain in the shoulder may be treated conservatively with temporary immobilization. For example, holding the shoulder in one place using a sling for rest. Physical therapy may be needed if problems continue. HOME CARE INSTRUCTIONS   Apply ice to the sore area for 15 to 20 minutes, 3 to 4 times per day for the first 2 days. Put the ice in a plastic bag. Place a towel between the bag of ice and your skin.   If you have or were given a shoulder sling and straps, do not remove for as long as directed by your caregiver or until you see a caregiver for a follow-up examination. If you need to remove it to shower or bathe, move your arm as little as possible.   Sleep on several pillows at night to lessen swelling and pain.   Only take over-the-counter or prescription medicines for pain, discomfort, or fever as directed by your caregiver.   Keep any follow-up appointments in order to avoid any type of permanent shoulder disability or chronic pain problems.  SEEK  MEDICAL CARE IF:   Pain in your shoulder increases or new pain develops in your arm, hand, or fingers.   Your hand or fingers are colder than your other hand.   You do not obtain pain relief with the medications or your pain becomes worse.  SEEK IMMEDIATE MEDICAL CARE IF:   Your arm, hand, or fingers are numb or tingling.   Your arm, hand, or fingers are swollen, painful, or turn white or blue.   You develop chest pain or shortness of breath.  MAKE SURE YOU:   Understand these instructions.   Will watch your condition.   Will get help right away if you are not doing well or get worse.  Document Released: 01/17/2005 Document Revised: 03/29/2011 Document Reviewed: 03/24/2011 [facility] Patient Information 2012 [place], [place].

## 2012-03-30 ENCOUNTER — Emergency Department (HOSPITAL_COMMUNITY)
Admission: EM | Admit: 2012-03-30 | Discharge: 2012-03-30 | Disposition: A | Discharge status: Home / Self Care | Payer: Self-pay | Attending: Emergency Medicine | Admitting: Emergency Medicine

## 2012-03-30 ENCOUNTER — Encounter (HOSPITAL_COMMUNITY): Payer: Self-pay | Admitting: *Deleted

## 2012-03-30 DIAGNOSIS — K0889 Other specified disorders of teeth and supporting structures: Secondary | ICD-10-CM

## 2012-03-30 DIAGNOSIS — K089 Disorder of teeth and supporting structures, unspecified: Secondary | ICD-10-CM | POA: Insufficient documentation

## 2012-03-30 DIAGNOSIS — Y929 Unspecified place or not applicable: Secondary | ICD-10-CM | POA: Insufficient documentation

## 2012-03-30 DIAGNOSIS — F17200 Nicotine dependence, unspecified, uncomplicated: Secondary | ICD-10-CM | POA: Insufficient documentation

## 2012-03-30 DIAGNOSIS — S9032XA Contusion of left foot, initial encounter: Secondary | ICD-10-CM

## 2012-03-30 DIAGNOSIS — S9030XA Contusion of unspecified foot, initial encounter: Secondary | ICD-10-CM | POA: Insufficient documentation

## 2012-03-30 DIAGNOSIS — Y939 Activity, unspecified: Secondary | ICD-10-CM | POA: Insufficient documentation

## 2012-03-30 DIAGNOSIS — IMO0002 Reserved for concepts with insufficient information to code with codable children: Secondary | ICD-10-CM | POA: Insufficient documentation

## 2012-03-30 MED ORDER — PENICILLIN V POTASSIUM 250 MG PO TABS
500.0000 mg | ORAL_TABLET | Freq: Once | ORAL | Status: AC
  Administered 2012-03-30: 500 mg via ORAL
  Filled 2012-03-30: qty 2

## 2012-03-30 MED ORDER — OXYCODONE-ACETAMINOPHEN 5-325 MG PO TABS
1.0000 | ORAL_TABLET | Freq: Once | ORAL | Status: AC
  Administered 2012-03-30: 1 via ORAL
  Filled 2012-03-30: qty 1

## 2012-03-30 MED ORDER — PENICILLIN V POTASSIUM 500 MG PO TABS: 500.0000 mg | ORAL_TABLET | Freq: Four times a day (QID) | ORAL | Status: DC

## 2012-03-30 MED ORDER — OXYCODONE-ACETAMINOPHEN 5-325 MG PO TABS: 1.0000 | ORAL_TABLET | ORAL | Status: AC | PRN
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
## 2012-03-30 NOTE — ED Notes (Signed)
Multiple complaints. Pt states pain to gum from the hook from partial plate. PT also states his joints pop when bending. Also states he hit his foot on something Monday and has been having pain since but it is better than it was.
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
## 2012-04-13 ENCOUNTER — Emergency Department (HOSPITAL_COMMUNITY): Payer: No Typology Code available for payment source

## 2012-04-13 ENCOUNTER — Emergency Department (HOSPITAL_COMMUNITY)
Admission: EM | Admit: 2012-04-13 | Discharge: 2012-04-13 | Disposition: A | Discharge status: Home / Self Care | Payer: No Typology Code available for payment source | Attending: Emergency Medicine | Admitting: Emergency Medicine

## 2012-04-13 ENCOUNTER — Encounter (HOSPITAL_COMMUNITY): Payer: Self-pay | Admitting: Emergency Medicine

## 2012-04-13 DIAGNOSIS — S60221A Contusion of right hand, initial encounter: Secondary | ICD-10-CM

## 2012-04-13 DIAGNOSIS — F17200 Nicotine dependence, unspecified, uncomplicated: Secondary | ICD-10-CM | POA: Insufficient documentation

## 2012-04-13 DIAGNOSIS — Y939 Activity, unspecified: Secondary | ICD-10-CM | POA: Insufficient documentation

## 2012-04-13 DIAGNOSIS — S60229A Contusion of unspecified hand, initial encounter: Secondary | ICD-10-CM | POA: Insufficient documentation

## 2012-04-13 DIAGNOSIS — M542 Cervicalgia: Secondary | ICD-10-CM | POA: Insufficient documentation

## 2012-04-13 DIAGNOSIS — Y92410 Unspecified street and highway as the place of occurrence of the external cause: Secondary | ICD-10-CM | POA: Insufficient documentation

## 2012-04-13 MED ORDER — OXYCODONE-ACETAMINOPHEN 5-325 MG PO TABS: 1.0000 | ORAL_TABLET | ORAL | Status: AC | PRN

## 2012-04-13 NOTE — ED Notes (Signed)
Pt waiting out in hall, explained to pt that he needs to stay in his room, pt asking the time and stating that he may just have to go
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
## 2012-05-15 ENCOUNTER — Emergency Department (HOSPITAL_COMMUNITY): Payer: Worker's Compensation

## 2012-05-15 ENCOUNTER — Encounter (HOSPITAL_COMMUNITY): Payer: Self-pay | Admitting: Emergency Medicine

## 2012-05-15 ENCOUNTER — Emergency Department (HOSPITAL_COMMUNITY)
Admission: EM | Admit: 2012-05-15 | Discharge: 2012-05-15 | Disposition: A | Discharge status: Home / Self Care | Payer: Worker's Compensation | Attending: Emergency Medicine | Admitting: Emergency Medicine

## 2012-05-15 DIAGNOSIS — S300XXA Contusion of lower back and pelvis, initial encounter: Secondary | ICD-10-CM | POA: Insufficient documentation

## 2012-05-15 DIAGNOSIS — T148XXA Other injury of unspecified body region, initial encounter: Secondary | ICD-10-CM | POA: Insufficient documentation

## 2012-05-15 DIAGNOSIS — R296 Repeated falls: Secondary | ICD-10-CM | POA: Insufficient documentation

## 2012-05-15 DIAGNOSIS — S5001XA Contusion of right elbow, initial encounter: Secondary | ICD-10-CM

## 2012-05-15 DIAGNOSIS — S5000XA Contusion of unspecified elbow, initial encounter: Secondary | ICD-10-CM | POA: Insufficient documentation

## 2012-05-15 DIAGNOSIS — Y990 Civilian activity done for income or pay: Secondary | ICD-10-CM | POA: Insufficient documentation

## 2012-05-15 DIAGNOSIS — R059 Cough, unspecified: Secondary | ICD-10-CM | POA: Insufficient documentation

## 2012-05-15 DIAGNOSIS — Y9389 Activity, other specified: Secondary | ICD-10-CM | POA: Insufficient documentation

## 2012-05-15 DIAGNOSIS — F17200 Nicotine dependence, unspecified, uncomplicated: Secondary | ICD-10-CM | POA: Insufficient documentation

## 2012-05-15 DIAGNOSIS — Y9289 Other specified places as the place of occurrence of the external cause: Secondary | ICD-10-CM | POA: Insufficient documentation

## 2012-05-15 DIAGNOSIS — R05 Cough: Secondary | ICD-10-CM | POA: Insufficient documentation

## 2012-05-15 MED ORDER — HYDROCODONE-ACETAMINOPHEN 5-325 MG PO TABS: 1.0000 | ORAL_TABLET | ORAL | Status: DC | PRN

## 2012-05-15 NOTE — ED Notes (Signed)
Pt c/o pain to right elbow after falling while carrying wood yesterday.

## 2012-05-15 NOTE — ED Provider Notes (Signed)
History     CSN: [id number]  Arrival date & time 05/15/12  1627   First MD Initiated Contact with Patient 05/15/12 1755      Chief Complaint  Patient presents with  . Fall   HPI Richard Valdez is a 32 y.o. male who presents to the ED with right arm pain. The pain started yesterday. The onset was sudden after a fall. He was carrying wood from outside to the shop at work and fell. Since the fall he has had pain in the right forearm and elbow. He rates the pain as 10/10. He also bruised his right hip but thinks it is ok now. The history was provided by the patient.  History reviewed. No pertinent past medical history.  Past Surgical History  Procedure Date  . Hernia repair     No family history on file.  History  Substance Use Topics  . Smoking status: Current Every Day Smoker -- 2.0 packs/day    Types: Cigarettes  . Smokeless tobacco: Not on file  . Alcohol Use: Yes     Comment: occasional      Review of Systems  Constitutional: Negative for fever, chills, diaphoresis and fatigue.  HENT: Negative for ear pain, congestion, sore throat, facial swelling, neck pain, neck stiffness, dental problem and sinus pressure.   Eyes: Negative for photophobia, pain and discharge.  Respiratory: Positive for cough (smoker). Negative for chest tightness and wheezing.   Gastrointestinal: Negative for nausea, vomiting, abdominal pain, diarrhea, constipation and abdominal distention.  Genitourinary: Negative for dysuria, frequency, flank pain and difficulty urinating.  Musculoskeletal: Negative for myalgias, back pain and gait problem.       Right hip pain  Skin: Negative for color change and rash.  Neurological: Negative for dizziness, speech difficulty, weakness, light-headedness, numbness and headaches.  Psychiatric/Behavioral: Negative for confusion and agitation. The patient is not nervous/anxious.     Allergies  Review of patient's allergies indicates no known allergies.  Home  Medications  No current outpatient prescriptions on file.  BP 132/72  Pulse 119  SpO2 96%  Physical Exam  Nursing note and vitals reviewed. Constitutional: He is oriented to person, place, and time. He appears well-developed and well-nourished.  HENT:  Head: Normocephalic and atraumatic.  Eyes: EOM are normal. Pupils are equal, round, and reactive to light.  Neck: Neck supple.  Cardiovascular: Normal rate.   Pulmonary/Chest: Effort normal.  Musculoskeletal: He exhibits tenderness.       Tenderness with palpation right buttock. Right forearm and elbow tender with palpation and range of motion. Radial pulse strong, adequate circulation, good touch sensation. Minimal swelling noted.  Neurological: He is alert and oriented to person, place, and time. No cranial nerve deficit.  Skin: Skin is warm and dry.  Psychiatric: He has a normal mood and affect. His behavior is normal. Judgment and thought content normal.   ED Course  Procedures Dg Elbow Complete Right  05/15/2012  *RADIOLOGY REPORT*  Clinical Data: Right elbow pain, swelling and redness following a fall.  RIGHT ELBOW - COMPLETE 3+ VIEW  Comparison: None.  Findings: Normal appearing bones and soft tissues without fracture, dislocation or effusion.  IMPRESSION: Normal examination.   Original Report Authenticated By: Beckie Salts, M.D.    Dg Forearm Right  05/15/2012  *RADIOLOGY REPORT*  Clinical Data: Right forearm pain, swelling and redness following a fall.  RIGHT FOREARM - 2 VIEW  Comparison: None.  Findings: Normal appearing bones and soft tissues without fracture or dislocation.  IMPRESSION: Normal examination.   Original Report Authenticated By: Beckie Salts, M.D.     Assessment: 32 y.o. male with right arm pain   Contusion right elbow and buttock   Strain right arm  Plan:  Pain management   Watson-Jones dressing   Ice, elevate   Follow up with Dr. Romeo Apple as needed Discussed with the patient and all questioned fully  answered.    Medication List     As of 05/15/2012  7:04 PM    START taking these medications         HYDROcodone-acetaminophen 5-325 MG per tablet   Commonly known as: NORCO/VICODIN   Take 1 tablet by mouth every 4 (four) hours as needed for pain.      ASK your doctor about these medications         ADVIL PM 200-25 MG Caps   Generic drug: Ibuprofen-Diphenhydramine HCl          Where to get your medications    These are the prescriptions that you need to pick up.   You may get these medications from any pharmacy.         HYDROcodone-acetaminophen 5-325 MG per tablet               Janne Napoleon, NP 05/15/12 1904

## 2012-05-15 NOTE — ED Notes (Signed)
Pt seen and assessed by ED NP for initial assessment.

## 2012-05-15 NOTE — ED Provider Notes (Signed)
Medical screening examination/treatment/procedure(s) were performed by non-physician practitioner and as supervising physician I was immediately available for consultation/collaboration.   Dione Booze, MD 05/15/12 1924

## 2012-08-30 ENCOUNTER — Emergency Department (HOSPITAL_COMMUNITY)
Admission: EM | Admit: 2012-08-30 | Discharge: 2012-08-30 | Disposition: A | Discharge status: Home / Self Care | Payer: Self-pay | Attending: Emergency Medicine | Admitting: Emergency Medicine

## 2012-08-30 ENCOUNTER — Encounter (HOSPITAL_COMMUNITY): Payer: Self-pay

## 2012-08-30 ENCOUNTER — Emergency Department (HOSPITAL_COMMUNITY): Payer: Self-pay

## 2012-08-30 DIAGNOSIS — Y939 Activity, unspecified: Secondary | ICD-10-CM | POA: Insufficient documentation

## 2012-08-30 DIAGNOSIS — M2550 Pain in unspecified joint: Secondary | ICD-10-CM | POA: Insufficient documentation

## 2012-08-30 DIAGNOSIS — K0889 Other specified disorders of teeth and supporting structures: Secondary | ICD-10-CM

## 2012-08-30 DIAGNOSIS — Y929 Unspecified place or not applicable: Secondary | ICD-10-CM | POA: Insufficient documentation

## 2012-08-30 DIAGNOSIS — S60229A Contusion of unspecified hand, initial encounter: Secondary | ICD-10-CM | POA: Insufficient documentation

## 2012-08-30 DIAGNOSIS — S6990XA Unspecified injury of unspecified wrist, hand and finger(s), initial encounter: Secondary | ICD-10-CM | POA: Insufficient documentation

## 2012-08-30 DIAGNOSIS — S60221A Contusion of right hand, initial encounter: Secondary | ICD-10-CM

## 2012-08-30 DIAGNOSIS — K089 Disorder of teeth and supporting structures, unspecified: Secondary | ICD-10-CM | POA: Insufficient documentation

## 2012-08-30 DIAGNOSIS — K1370 Unspecified lesions of oral mucosa: Secondary | ICD-10-CM | POA: Insufficient documentation

## 2012-08-30 DIAGNOSIS — IMO0002 Reserved for concepts with insufficient information to code with codable children: Secondary | ICD-10-CM | POA: Insufficient documentation

## 2012-08-30 DIAGNOSIS — F17200 Nicotine dependence, unspecified, uncomplicated: Secondary | ICD-10-CM | POA: Insufficient documentation

## 2012-08-30 MED ORDER — AMOXICILLIN 500 MG PO CAPS: 500.0000 mg | ORAL_CAPSULE | Freq: Three times a day (TID) | ORAL | Status: DC

## 2012-08-30 MED ORDER — TRAMADOL HCL 50 MG PO TABS: 50.0000 mg | ORAL_TABLET | Freq: Four times a day (QID) | ORAL | Status: DC | PRN

## 2012-08-30 NOTE — ED Notes (Signed)
Complain of right hand pain and dental pain that he has had for a while

## 2012-08-31 NOTE — ED Provider Notes (Signed)
History     CSN: [id number]  Arrival date & time 08/30/12  1428   First MD Initiated Contact with Patient 08/30/12 1438      Chief Complaint  Patient presents with  . Dental Pain    (Consider location/radiation/quality/duration/timing/severity/associated sxs/prior treatment) Patient is a 32 y.o. male presenting with tooth pain and hand injury. The history is provided by the patient.  Dental PainThe primary symptoms include mouth pain. Primary symptoms do not include oral bleeding, oral lesions, headaches, fever, shortness of breath, sore throat, angioedema or cough. The symptoms began more than 1 week ago. The symptoms are worsening. The symptoms are recurrent. The symptoms occur constantly.  Affected locations include: teeth and gum(s).  Additional symptoms include: dental sensitivity to temperature and gum tenderness. Additional symptoms do not include: gum swelling, purulent gums, trismus, jaw pain, facial swelling, trouble swallowing, pain with swallowing, ear pain and swollen glands. Medical issues include: smoking and periodontal disease.   Hand Injury Location:  Hand Time since incident:  2 days Injury: yes   Mechanism of injury comment:  Direct blow Hand location:  R hand Pain details:    Quality:  Aching   Radiates to:  Does not radiate   Severity:  Moderate   Onset quality:  Sudden   Timing:  Constant   Progression:  Unchanged Chronicity:  New Handedness:  Right-handed Dislocation: no   Foreign body present:  No foreign bodies Prior injury to area:  No Relieved by:  Nothing Worsened by:  Movement (gripping or holding objects) Ineffective treatments:  None tried Associated symptoms: no back pain, no decreased range of motion, no fever, no neck pain, no numbness, no stiffness, no swelling and no tingling     History reviewed. No pertinent past medical history.  Past Surgical History  Procedure Laterality Date  . Hernia repair      No family history on  file.  History  Substance Use Topics  . Smoking status: Current Every Day Smoker -- 2.00 packs/day    Types: Cigarettes  . Smokeless tobacco: Not on file  . Alcohol Use: Yes     Comment: occasional      Review of Systems  Constitutional: Negative for fever, chills and appetite change.  HENT: Positive for dental problem. Negative for ear pain, congestion, sore throat, facial swelling, trouble swallowing, neck pain and neck stiffness.   Eyes: Negative for pain and visual disturbance.  Respiratory: Negative for cough and shortness of breath.   Genitourinary: Negative for dysuria and difficulty urinating.  Musculoskeletal: Positive for arthralgias. Negative for back pain, joint swelling and stiffness.  Skin: Negative for color change and wound.  Neurological: Negative for dizziness, facial asymmetry and headaches.  Hematological: Negative for adenopathy.  All other systems reviewed and are negative.    Allergies  Review of patient's allergies indicates no known allergies.  Home Medications   Current Outpatient Rx  Name  Route  Sig  Dispense  Refill  . amoxicillin (AMOXIL) 500 MG capsule   Oral   Take 1 capsule (500 mg total) by mouth 3 (three) times daily. For 10 days   30 capsule   0   . traMADol (ULTRAM) 50 MG tablet   Oral   Take 1 tablet (50 mg total) by mouth every 6 (six) hours as needed for pain.   15 tablet   0     BP 111/75  Pulse 93  Temp(Src) 97.6 F (36.4 C) (Oral)  Resp 21  Ht 5\' 8"  (  1.727 m)  Wt 180 lb (81.647 kg)  BMI 27.38 kg/m2  SpO2 96%  Physical Exam  Nursing note and vitals reviewed. Constitutional: He is oriented to person, place, and time. He appears well-developed and well-nourished. No distress.  HENT:  Head: Normocephalic and atraumatic. No trismus in the jaw.  Right Ear: Tympanic membrane and ear canal normal.  Left Ear: Tympanic membrane and ear canal normal.  Mouth/Throat: Uvula is midline, oropharynx is clear and moist and  mucous membranes are normal. Dental caries present. No dental abscesses or edematous.    Widespread dental decay and discoloration of the lower left premolars.  No dental abscess, trismus, of facial edema.  Neck: Normal range of motion. Neck supple.  Cardiovascular: Normal rate, regular rhythm, normal heart sounds and intact distal pulses.   No murmur heard. Pulmonary/Chest: Effort normal and breath sounds normal. No respiratory distress.  Musculoskeletal: Normal range of motion. He exhibits tenderness. He exhibits no edema.       Right hand: He exhibits tenderness and bony tenderness. He exhibits normal range of motion, normal two-point discrimination, normal capillary refill, no deformity, no laceration and no swelling. Normal sensation noted. Normal strength noted.       Hands: ttp of the dorsal right hand along the metacarpals.  No bruising, erythema or STS.  Radial pulse brisk, distal sensation intact.  CR< 2 sec.  No bony deformity.  Pain reproduced with gripping, but pt has full ROM of the fingers.  Lymphadenopathy:    He has no cervical adenopathy.  Neurological: He is alert and oriented to person, place, and time. He exhibits normal muscle tone. Coordination normal.  Skin: Skin is warm and dry.    ED Course  Procedures (including critical care time)  Labs Reviewed - No data to display Dg Hand Complete Right  08/30/2012  *RADIOLOGY REPORT*  Clinical Data: Right hand pain and swelling  RIGHT HAND - COMPLETE 3+ VIEW  Comparison: None.  Findings: No fracture or dislocation.  No soft tissue abnormality. No radiopaque foreign body.  IMPRESSION: Normal exam.   Original Report Authenticated By: Christiana Pellant, M.D.      1. Contusion, hand, right, initial encounter   2. Pain, dental       MDM    Pt has appt in June with his dentist.  Agrees to elevate and ice to the hand.  Pt also agrees to f/u with Dr. Hilda Lias regarding the hand pain if it's not improving   The patient appears  reasonably screened and/or stabilized for discharge and I doubt any other medical condition or other [facility] requiring further screening, evaluation, or treatment in the ED at this time prior to discharge.      Renesmee Raine L. Trisha Mangle, PA-C 08/31/12 9147

## 2012-08-31 NOTE — ED Provider Notes (Signed)
Medical screening examination/treatment/procedure(s) were performed by non-physician practitioner and as supervising physician I was immediately available for consultation/collaboration. Devoria Albe, MD, Armando Gang   Ward Givens, MD 08/31/12 1630

## 2012-10-04 ENCOUNTER — Emergency Department (HOSPITAL_COMMUNITY)
Admission: EM | Admit: 2012-10-04 | Discharge: 2012-10-04 | Disposition: A | Discharge status: Home / Self Care | Payer: Self-pay | Attending: Emergency Medicine | Admitting: Emergency Medicine

## 2012-10-04 ENCOUNTER — Emergency Department (HOSPITAL_COMMUNITY): Payer: Self-pay

## 2012-10-04 ENCOUNTER — Encounter (HOSPITAL_COMMUNITY): Payer: Self-pay

## 2012-10-04 DIAGNOSIS — S301XXA Contusion of abdominal wall, initial encounter: Secondary | ICD-10-CM | POA: Insufficient documentation

## 2012-10-04 DIAGNOSIS — Y9389 Activity, other specified: Secondary | ICD-10-CM | POA: Insufficient documentation

## 2012-10-04 DIAGNOSIS — Y92410 Unspecified street and highway as the place of occurrence of the external cause: Secondary | ICD-10-CM | POA: Insufficient documentation

## 2012-10-04 DIAGNOSIS — F17200 Nicotine dependence, unspecified, uncomplicated: Secondary | ICD-10-CM | POA: Insufficient documentation

## 2012-10-04 DIAGNOSIS — R1110 Vomiting, unspecified: Secondary | ICD-10-CM | POA: Insufficient documentation

## 2012-10-04 LAB — CBC WITH DIFFERENTIAL/PLATELET
Basophils Relative: 0 % (ref 0–1)
Eosinophils Absolute: 0.1 10*3/uL (ref 0.0–0.7)
MCH: 33.4 pg (ref 26.0–34.0)
MCHC: 34.6 g/dL (ref 30.0–36.0)
Neutro Abs: 5.1 10*3/uL (ref 1.7–7.7)
Neutrophils Relative %: 60 % (ref 43–77)
Platelets: 247 10*3/uL (ref 150–400)
RBC: 4.46 MIL/uL (ref 4.22–5.81)

## 2012-10-04 LAB — COMPREHENSIVE METABOLIC PANEL
ALT: 14 U/L (ref 0–53)
AST: 19 U/L (ref 0–37)
Albumin: 3.8 g/dL (ref 3.5–5.2)
Alkaline Phosphatase: 71 U/L (ref 39–117)
Potassium: 3.5 mEq/L (ref 3.5–5.1)
Sodium: 135 mEq/L (ref 135–145)
Total Protein: 7.3 g/dL (ref 6.0–8.3)

## 2012-10-04 MED ORDER — OXYCODONE-ACETAMINOPHEN 5-325 MG PO TABS: 1.0000 | ORAL_TABLET | Freq: Four times a day (QID) | ORAL | Status: DC | PRN

## 2012-10-04 MED ORDER — DIAZEPAM 5 MG PO TABS: 5.0000 mg | ORAL_TABLET | Freq: Four times a day (QID) | ORAL | Status: DC | PRN

## 2012-10-04 MED ORDER — IOHEXOL 300 MG/ML  SOLN
50.0000 mL | Freq: Once | INTRAMUSCULAR | Status: AC | PRN
  Administered 2012-10-04: 50 mL via ORAL

## 2012-10-04 MED ORDER — IOHEXOL 300 MG/ML  SOLN
100.0000 mL | Freq: Once | INTRAMUSCULAR | Status: AC | PRN
  Administered 2012-10-04: 100 mL via INTRAVENOUS

## 2012-10-04 MED ORDER — SODIUM CHLORIDE 0.9 % IV BOLUS (SEPSIS)
500.0000 mL | Freq: Once | INTRAVENOUS | Status: AC
  Administered 2012-10-04: 500 mL via INTRAVENOUS
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
## 2012-10-26 ENCOUNTER — Emergency Department (HOSPITAL_COMMUNITY): Payer: Self-pay

## 2012-10-26 ENCOUNTER — Emergency Department (HOSPITAL_COMMUNITY)
Admission: EM | Admit: 2012-10-26 | Discharge: 2012-10-26 | Disposition: A | Discharge status: Home / Self Care | Payer: Self-pay | Attending: Emergency Medicine | Admitting: Emergency Medicine

## 2012-10-26 ENCOUNTER — Encounter (HOSPITAL_COMMUNITY): Payer: Self-pay | Admitting: Emergency Medicine

## 2012-10-26 DIAGNOSIS — S61411A Laceration without foreign body of right hand, initial encounter: Secondary | ICD-10-CM

## 2012-10-26 DIAGNOSIS — R42 Dizziness and giddiness: Secondary | ICD-10-CM | POA: Insufficient documentation

## 2012-10-26 DIAGNOSIS — Z79899 Other long term (current) drug therapy: Secondary | ICD-10-CM | POA: Insufficient documentation

## 2012-10-26 DIAGNOSIS — Y9289 Other specified places as the place of occurrence of the external cause: Secondary | ICD-10-CM | POA: Insufficient documentation

## 2012-10-26 DIAGNOSIS — F17200 Nicotine dependence, unspecified, uncomplicated: Secondary | ICD-10-CM | POA: Insufficient documentation

## 2012-10-26 DIAGNOSIS — S61209A Unspecified open wound of unspecified finger without damage to nail, initial encounter: Secondary | ICD-10-CM | POA: Insufficient documentation

## 2012-10-26 DIAGNOSIS — Y939 Activity, unspecified: Secondary | ICD-10-CM | POA: Insufficient documentation

## 2012-10-26 DIAGNOSIS — W268XXA Contact with other sharp object(s), not elsewhere classified, initial encounter: Secondary | ICD-10-CM | POA: Insufficient documentation

## 2012-10-26 MED ORDER — LIDOCAINE HCL (PF) 2 % IJ SOLN
INTRAMUSCULAR | Status: AC
  Administered 2012-10-26: 10 mL
  Filled 2012-10-26: qty 10

## 2012-10-26 MED ORDER — LIDOCAINE HCL (PF) 1 % IJ SOLN
INTRAMUSCULAR | Status: AC
  Filled 2012-10-26: qty 5

## 2012-10-26 MED ORDER — BUPIVACAINE HCL (PF) 0.5 % IJ SOLN
INTRAMUSCULAR | Status: AC
  Administered 2012-10-26: 150 mg
  Filled 2012-10-26: qty 30

## 2012-10-26 MED ORDER — CEFAZOLIN SODIUM 1-5 GM-% IV SOLN
1.0000 g | Freq: Once | INTRAVENOUS | Status: AC
  Administered 2012-10-26: 1 g via INTRAVENOUS
  Filled 2012-10-26: qty 50

## 2012-10-26 MED ORDER — SODIUM CHLORIDE 0.9 % IV SOLN
Freq: Once | INTRAVENOUS | Status: AC
  Administered 2012-10-26: 20 mL/h via INTRAVENOUS

## 2012-10-26 MED ORDER — TRAMADOL HCL 50 MG PO TABS: 50.0000 mg | ORAL_TABLET | Freq: Four times a day (QID) | ORAL | Status: DC | PRN

## 2012-10-26 MED ORDER — CEPHALEXIN 500 MG PO CAPS: 500.0000 mg | ORAL_CAPSULE | Freq: Four times a day (QID) | ORAL | Status: DC

## 2012-10-26 NOTE — ED Notes (Signed)
Unable to FLEX OR EXTEND 4TH AND 5TH FINGERS.  Minimal movement of index and middle.

## 2012-10-26 NOTE — ED Notes (Signed)
Pt has also cut proximal anterior area of middle finger also.

## 2012-10-26 NOTE — ED Provider Notes (Signed)
History    CSN: [id number] Arrival date & time 10/26/12  1851  First MD Initiated Contact with Patient 10/26/12 1915     Chief Complaint  Patient presents with  . Laceration   (Consider location/radiation/quality/duration/timing/severity/associated sxs/prior Treatment) Patient is a 32 y.o. male presenting with skin laceration. The history is provided by the patient (the pt cut his right hand on a fan in a car). No language interpreter was used.  Laceration Location: hand. Length (cm):  6 cm on right ring finger and 2 cm on middle finger right Depth:  Cutaneous Quality: avulsion   Bleeding: controlled   Time since incident:  2 hours Laceration mechanism:  Metal edge  History reviewed. No pertinent past medical history. Past Surgical History  Procedure Laterality Date  . Hernia repair     History reviewed. No pertinent family history. History  Substance Use Topics  . Smoking status: Current Every Day Smoker -- 2.00 packs/day    Types: Cigarettes  . Smokeless tobacco: Not on file  . Alcohol Use: Yes     Comment: occasional    Review of Systems  Constitutional: Negative for appetite change and fatigue.  HENT: Negative for congestion, sinus pressure and ear discharge.   Eyes: Negative for discharge.  Respiratory: Negative for cough.   Cardiovascular: Negative for chest pain.  Gastrointestinal: Negative for abdominal pain and diarrhea.  Genitourinary: Negative for frequency and hematuria.  Musculoskeletal: Negative for back pain.       Lac to right hand  Skin: Negative for rash.  Neurological: Negative for seizures and headaches.  Psychiatric/Behavioral: Negative for hallucinations.    Allergies  Review of patient's allergies indicates no known allergies.  Home Medications   Current Outpatient Rx  Name  Route  Sig  Dispense  Refill  . acetaminophen (TYLENOL) 500 MG tablet   Oral   Take 500 mg by mouth every 6 (six) hours as needed for pain.         .  cephALEXin (KEFLEX) 500 MG capsule   Oral   Take 1 capsule (500 mg total) by mouth 4 (four) times daily.   28 capsule   0   . diazepam (VALIUM) 5 MG tablet   Oral   Take 1 tablet (5 mg total) by mouth every 6 (six) hours as needed (spasm).   10 tablet   0   . HYDROcodone-acetaminophen (NORCO/VICODIN) 5-325 MG per tablet   Oral   Take 1 tablet by mouth once as needed for pain.         Marland Kitchen oxyCODONE-acetaminophen (PERCOCET/ROXICET) 5-325 MG per tablet   Oral   Take 1-2 tablets by mouth every 6 (six) hours as needed for pain.   10 tablet   0   . traMADol (ULTRAM) 50 MG tablet   Oral   Take 1 tablet (50 mg total) by mouth every 6 (six) hours as needed for pain.   20 tablet   0    BP 103/67  Pulse 81  Temp(Src) 98.1 F (36.7 C) (Oral)  Resp 20  Ht 5\' 8"  (1.727 m)  Wt 180 lb (81.647 kg)  BMI 27.38 kg/m2  SpO2 97% Physical Exam  Constitutional: He is oriented to person, place, and time. He appears well-developed.  HENT:  Head: Normocephalic.  Eyes: Conjunctivae are normal.  Neck: No tracheal deviation present.  Cardiovascular:  No murmur heard. Musculoskeletal:  Pt had a 2cm lac to the right 3rd finger and a 6 cm lac to the dorsum  of right 4th finger.  The laceration cut some to the extensor tendon.  Pt had from of right ring finger  Neurological: He is oriented to person, place, and time.  Skin: Skin is warm.  Psychiatric: He has a normal mood and affect.    ED Course  LACERATION REPAIR Date/Time: 10/26/2012 9:38 PM Performed by: Quanta Robertshaw L Authorized by: Bethann Berkshire L Comments: Digital block with marcaine to numb right 3rd and 4th fingers.  2 cm lac to 3rd finger closed with 4, 4-0 sutures,  6 cm lac to 4th finger closed with 7, 4-0 sutures and 3, 5-0 sutures   (including critical care time) Labs Reviewed - No data to display Dg Hand Complete Right  10/26/2012   *RADIOLOGY REPORT*  Clinical Data: Laceration.  RIGHT HAND - COMPLETE 3+ VIEW  Comparison:  None  Findings: No acute bony abnormality.  Specifically, no fracture, subluxation, or dislocation.  Soft tissues are intact. Joint spaces are maintained.  Normal bone mineralization.  Small radiopaque foreign bodies are noted in the ring finger near the PIP joint, best seen on the oblique view.  IMPRESSION: No bony abnormality.  Small radiopaque foreign bodies within the soft tissues near the right fourth PIP joint, seen best on the oblique view.   Original Report Authenticated By: Charlett Nose, M.D.   1. Laceration of hand, right, initial encounter     MDM  Lac hand sutured and referred to hand md  Benny Lennert, MD 10/26/12 2141

## 2012-10-26 NOTE — ED Notes (Signed)
Pt working on car and stuck hand in fan while running. Pt has deep laceration right ring finger from medial mid knuckle to meeting the pinkyl gapping. Pt groggy. C/o being lightheaded.soaking in betadine now

## 2012-10-29 ENCOUNTER — Encounter (HOSPITAL_COMMUNITY): Payer: Self-pay

## 2012-10-29 ENCOUNTER — Encounter (HOSPITAL_COMMUNITY): Payer: Self-pay | Admitting: Emergency Medicine

## 2012-10-29 ENCOUNTER — Observation Stay (HOSPITAL_COMMUNITY)
Admission: RE | Admit: 2012-10-29 | Discharge: 2012-10-30 | Disposition: A | Discharge status: Home / Self Care | Payer: MEDICAID | Source: Ambulatory Visit | Attending: Orthopedic Surgery | Admitting: Orthopedic Surgery

## 2012-10-29 ENCOUNTER — Emergency Department (HOSPITAL_COMMUNITY)
Admission: EM | Admit: 2012-10-29 | Discharge: 2012-10-29 | Disposition: A | Discharge status: Home / Self Care | Payer: Self-pay | Attending: Emergency Medicine | Admitting: Emergency Medicine

## 2012-10-29 ENCOUNTER — Encounter (HOSPITAL_COMMUNITY)
Admission: RE | Disposition: A | Discharge status: Home / Self Care | Payer: Self-pay | Source: Ambulatory Visit | Attending: Orthopedic Surgery

## 2012-10-29 ENCOUNTER — Ambulatory Visit (HOSPITAL_COMMUNITY): Payer: Self-pay | Admitting: Anesthesiology

## 2012-10-29 ENCOUNTER — Encounter (HOSPITAL_COMMUNITY): Payer: Self-pay | Admitting: Anesthesiology

## 2012-10-29 DIAGNOSIS — Y9289 Other specified places as the place of occurrence of the external cause: Secondary | ICD-10-CM | POA: Insufficient documentation

## 2012-10-29 DIAGNOSIS — S61409A Unspecified open wound of unspecified hand, initial encounter: Secondary | ICD-10-CM | POA: Insufficient documentation

## 2012-10-29 DIAGNOSIS — W230XXA Caught, crushed, jammed, or pinched between moving objects, initial encounter: Secondary | ICD-10-CM | POA: Insufficient documentation

## 2012-10-29 DIAGNOSIS — S61209A Unspecified open wound of unspecified finger without damage to nail, initial encounter: Principal | ICD-10-CM | POA: Insufficient documentation

## 2012-10-29 DIAGNOSIS — T798XXA Other early complications of trauma, initial encounter: Secondary | ICD-10-CM

## 2012-10-29 DIAGNOSIS — Z79899 Other long term (current) drug therapy: Secondary | ICD-10-CM | POA: Insufficient documentation

## 2012-10-29 DIAGNOSIS — Y9389 Activity, other specified: Secondary | ICD-10-CM | POA: Insufficient documentation

## 2012-10-29 DIAGNOSIS — F17200 Nicotine dependence, unspecified, uncomplicated: Secondary | ICD-10-CM | POA: Insufficient documentation

## 2012-10-29 DIAGNOSIS — W268XXA Contact with other sharp object(s), not elsewhere classified, initial encounter: Secondary | ICD-10-CM | POA: Insufficient documentation

## 2012-10-29 HISTORY — PX: I & D EXTREMITY: SHX5045

## 2012-10-29 LAB — CBC WITH DIFFERENTIAL/PLATELET
Basophils Absolute: 0 10*3/uL (ref 0.0–0.1)
Basophils Relative: 0 % (ref 0–1)
Eosinophils Absolute: 0 10*3/uL (ref 0.0–0.7)
Hemoglobin: 13.6 g/dL (ref 13.0–17.0)
MCH: 33.3 pg (ref 26.0–34.0)
MCHC: 34.2 g/dL (ref 30.0–36.0)
Monocytes Absolute: 0.5 10*3/uL (ref 0.1–1.0)
Monocytes Relative: 6 % (ref 3–12)
Neutro Abs: 6 10*3/uL (ref 1.7–7.7)
Neutrophils Relative %: 72 % (ref 43–77)
RDW: 13.1 % (ref 11.5–15.5)

## 2012-10-29 LAB — BASIC METABOLIC PANEL
BUN: 9 mg/dL (ref 6–23)
Creatinine, Ser: 0.65 mg/dL (ref 0.50–1.35)
GFR calc Af Amer: >90 mL/min (ref >90)
GFR calc non Af Amer: >90 mL/min (ref >90)
Potassium: 3.2 mEq/L — ABNORMAL LOW (ref 3.5–5.1)

## 2012-10-29 SURGERY — IRRIGATION AND DEBRIDEMENT EXTREMITY
Anesthesia: General | Site: Hand | Laterality: Right | Wound class: Contaminated

## 2012-10-29 MED ORDER — CLINDAMYCIN PHOSPHATE 600 MG/50ML IV SOLN
600.0000 mg | Freq: Three times a day (TID) | INTRAVENOUS | Status: DC
  Administered 2012-10-30 (×2): 600 mg via INTRAVENOUS
  Filled 2012-10-29 (×4): qty 50

## 2012-10-29 MED ORDER — BUPIVACAINE HCL (PF) 0.5 % IJ SOLN
INTRAMUSCULAR | Status: AC
  Filled 2012-10-29: qty 30

## 2012-10-29 MED ORDER — OXYCODONE HCL 5 MG/5ML PO SOLN: 5.0000 mg | Freq: Once | ORAL | Status: AC | PRN

## 2012-10-29 MED ORDER — CLINDAMYCIN PHOSPHATE 600 MG/50ML IV SOLN
600.0000 mg | Freq: Once | INTRAVENOUS | Status: AC
  Administered 2012-10-29: 600 mg via INTRAVENOUS
  Filled 2012-10-29: qty 50

## 2012-10-29 MED ORDER — OXYCODONE-ACETAMINOPHEN 5-325 MG PO TABS: 1.0000 | ORAL_TABLET | Freq: Once | ORAL | Status: DC

## 2012-10-29 MED ORDER — MIDAZOLAM HCL 5 MG/5ML IJ SOLN
INTRAMUSCULAR | Status: DC | PRN
  Administered 2012-10-29: 2 mg via INTRAVENOUS

## 2012-10-29 MED ORDER — NAPROXEN 500 MG PO TABS: 500.0000 mg | ORAL_TABLET | Freq: Two times a day (BID) | ORAL | Status: DC

## 2012-10-29 MED ORDER — FENTANYL CITRATE 0.05 MG/ML IJ SOLN
INTRAMUSCULAR | Status: DC | PRN
  Administered 2012-10-29 (×2): 50 ug via INTRAVENOUS
  Administered 2012-10-29: 100 ug via INTRAVENOUS

## 2012-10-29 MED ORDER — OXYCODONE HCL 5 MG PO TABS
5.0000 mg | ORAL_TABLET | Freq: Once | ORAL | Status: AC | PRN
  Administered 2012-10-29: 5 mg via ORAL

## 2012-10-29 MED ORDER — HYDROMORPHONE HCL PF 1 MG/ML IJ SOLN
INTRAMUSCULAR | Status: AC
  Filled 2012-10-29: qty 1

## 2012-10-29 MED ORDER — CLINDAMYCIN HCL 150 MG PO CAPS: 300.0000 mg | ORAL_CAPSULE | Freq: Once | ORAL | Status: DC

## 2012-10-29 MED ORDER — OXYCODONE-ACETAMINOPHEN 5-325 MG PO TABS: 2.0000 | ORAL_TABLET | Freq: Once | ORAL | Status: DC

## 2012-10-29 MED ORDER — CLINDAMYCIN HCL 300 MG PO CAPS: 300.0000 mg | ORAL_CAPSULE | Freq: Three times a day (TID) | ORAL | Status: DC

## 2012-10-29 MED ORDER — HYDROCODONE-ACETAMINOPHEN 5-325 MG PO TABS
1.0000 | ORAL_TABLET | ORAL | Status: DC | PRN
  Administered 2012-10-30: 2 via ORAL
  Filled 2012-10-29: qty 2

## 2012-10-29 MED ORDER — NAPROXEN 500 MG PO TABS
500.0000 mg | ORAL_TABLET | Freq: Two times a day (BID) | ORAL | Status: DC
  Administered 2012-10-30: 500 mg via ORAL
  Filled 2012-10-29 (×3): qty 1

## 2012-10-29 MED ORDER — DOCUSATE SODIUM 100 MG PO CAPS
100.0000 mg | ORAL_CAPSULE | Freq: Two times a day (BID) | ORAL | Status: DC
  Administered 2012-10-29 – 2012-10-30 (×2): 100 mg via ORAL
  Filled 2012-10-29 (×3): qty 1

## 2012-10-29 MED ORDER — HYDROMORPHONE HCL PF 1 MG/ML IJ SOLN
1.0000 mg | Freq: Once | INTRAMUSCULAR | Status: AC
  Administered 2012-10-29: 1 mg via INTRAVENOUS
  Filled 2012-10-29: qty 1

## 2012-10-29 MED ORDER — PROPOFOL 10 MG/ML IV BOLUS
INTRAVENOUS | Status: DC | PRN
  Administered 2012-10-29: 200 mg via INTRAVENOUS

## 2012-10-29 MED ORDER — OXYCODONE HCL 5 MG PO TABS
5.0000 mg | ORAL_TABLET | ORAL | Status: DC | PRN
  Administered 2012-10-30 (×3): 10 mg via ORAL
  Filled 2012-10-29 (×3): qty 2

## 2012-10-29 MED ORDER — HYDROMORPHONE HCL PF 1 MG/ML IJ SOLN
0.2500 mg | INTRAMUSCULAR | Status: DC | PRN
  Administered 2012-10-29 (×4): 0.5 mg via INTRAVENOUS

## 2012-10-29 MED ORDER — MAGNESIUM HYDROXIDE 400 MG/5ML PO SUSP: 30.0000 mL | Freq: Every day | ORAL | Status: DC | PRN

## 2012-10-29 MED ORDER — OXYCODONE HCL 5 MG PO TABS
ORAL_TABLET | ORAL | Status: AC
  Filled 2012-10-29: qty 1

## 2012-10-29 MED ORDER — KETOROLAC TROMETHAMINE 30 MG/ML IJ SOLN
30.0000 mg | Freq: Once | INTRAMUSCULAR | Status: DC
  Filled 2012-10-29: qty 1

## 2012-10-29 MED ORDER — MORPHINE SULFATE 4 MG/ML IJ SOLN
4.0000 mg | Freq: Once | INTRAMUSCULAR | Status: AC
  Administered 2012-10-29: 4 mg via INTRAVENOUS
  Filled 2012-10-29: qty 1

## 2012-10-29 MED ORDER — BUPIVACAINE HCL 0.5 % IJ SOLN
INTRAMUSCULAR | Status: DC | PRN
  Administered 2012-10-29: 10 mL

## 2012-10-29 MED ORDER — SODIUM CHLORIDE 0.9 % IR SOLN
Status: DC | PRN
  Administered 2012-10-29: 1000 mL

## 2012-10-29 MED ORDER — HYDROMORPHONE HCL PF 1 MG/ML IJ SOLN
0.5000 mg | INTRAMUSCULAR | Status: DC | PRN
  Administered 2012-10-29 – 2012-10-30 (×3): 1 mg via INTRAVENOUS
  Filled 2012-10-29 (×3): qty 1

## 2012-10-29 MED ORDER — ONDANSETRON HCL 4 MG/2ML IJ SOLN
INTRAMUSCULAR | Status: DC | PRN
  Administered 2012-10-29: 4 mg via INTRAVENOUS

## 2012-10-29 MED ORDER — LACTATED RINGERS IV SOLN
INTRAVENOUS | Status: DC | PRN
  Administered 2012-10-29: 20:00:00 via INTRAVENOUS

## 2012-10-29 MED ORDER — ONDANSETRON HCL 4 MG/2ML IJ SOLN: 4.0000 mg | Freq: Four times a day (QID) | INTRAMUSCULAR | Status: DC | PRN

## 2012-10-29 MED ORDER — ONDANSETRON HCL 4 MG PO TABS: 4.0000 mg | ORAL_TABLET | Freq: Four times a day (QID) | ORAL | Status: DC | PRN

## 2012-10-29 MED ORDER — LIDOCAINE HCL (CARDIAC) 20 MG/ML IV SOLN
INTRAVENOUS | Status: DC | PRN
  Administered 2012-10-29: 60 mg via INTRAVENOUS

## 2012-10-29 MED ORDER — OXYCODONE-ACETAMINOPHEN 5-325 MG PO TABS
ORAL_TABLET | ORAL | Status: AC
  Filled 2012-10-29: qty 1

## 2012-10-29 MED ORDER — KETOROLAC TROMETHAMINE 30 MG/ML IJ SOLN
30.0000 mg | Freq: Once | INTRAMUSCULAR | Status: AC
  Administered 2012-10-29: 30 mg via INTRAVENOUS
  Filled 2012-10-29: qty 1

## 2012-10-29 MED ORDER — DEXTROSE 5 % IV SOLN: 900.0000 mg | Freq: Once | INTRAVENOUS | Status: DC

## 2012-10-29 MED ORDER — CLINDAMYCIN PHOSPHATE 900 MG/50ML IV SOLN
900.0000 mg | Freq: Once | INTRAVENOUS | Status: AC
  Administered 2012-10-29: 900 mg via INTRAVENOUS
  Filled 2012-10-29: qty 50

## 2012-10-29 MED ORDER — SODIUM CHLORIDE 0.9 % IV SOLN
Freq: Once | INTRAVENOUS | Status: AC
  Administered 2012-10-29: 09:00:00 via INTRAVENOUS

## 2012-10-29 MED ORDER — OXYCODONE HCL 5 MG PO TABS: 5.0000 mg | ORAL_TABLET | ORAL | Status: DC | PRN

## 2012-10-29 SURGICAL SUPPLY — 47 items
BANDAGE ELASTIC 3 VELCRO ST LF (GAUZE/BANDAGES/DRESSINGS) IMPLANT
BANDAGE ELASTIC 4 VELCRO ST LF (GAUZE/BANDAGES/DRESSINGS) ×2 IMPLANT
BANDAGE GAUZE ELAST BULKY 4 IN (GAUZE/BANDAGES/DRESSINGS) ×3 IMPLANT
BNDG CMPR 9X4 STRL LF SNTH (GAUZE/BANDAGES/DRESSINGS)
BNDG COHESIVE 4X5 TAN STRL (GAUZE/BANDAGES/DRESSINGS) ×1 IMPLANT
BNDG ESMARK 4X9 LF (GAUZE/BANDAGES/DRESSINGS) IMPLANT
CHLORAPREP W/TINT 26ML (MISCELLANEOUS) ×2 IMPLANT
CLOTH BEACON ORANGE TIMEOUT ST (SAFETY) ×2 IMPLANT
COVER SURGICAL LIGHT HANDLE (MISCELLANEOUS) ×2 IMPLANT
CUFF TOURNIQUET SINGLE 18IN (TOURNIQUET CUFF) ×2 IMPLANT
CUFF TOURNIQUET SINGLE 24IN (TOURNIQUET CUFF) IMPLANT
DRAPE SURG 17X23 STRL (DRAPES) ×2 IMPLANT
DRSG ADAPTIC 3X8 NADH LF (GAUZE/BANDAGES/DRESSINGS) ×2 IMPLANT
DRSG EMULSION OIL 3X3 NADH (GAUZE/BANDAGES/DRESSINGS) ×1 IMPLANT
ELECT REM PT RETURN 9FT ADLT (ELECTROSURGICAL)
ELECTRODE REM PT RTRN 9FT ADLT (ELECTROSURGICAL) IMPLANT
EVACUATOR 1/8 PVC DRAIN (DRAIN) IMPLANT
GLOVE BIO SURGEON STRL SZ7.5 (GLOVE) ×2 IMPLANT
GLOVE BIOGEL PI IND STRL 8 (GLOVE) ×1 IMPLANT
GLOVE BIOGEL PI INDICATOR 8 (GLOVE) ×1
GOWN PREVENTION PLUS XXLARGE (GOWN DISPOSABLE) ×2 IMPLANT
GOWN STRL NON-REIN LRG LVL3 (GOWN DISPOSABLE) ×6 IMPLANT
HANDPIECE INTERPULSE COAX TIP (DISPOSABLE)
KIT BASIN OR (CUSTOM PROCEDURE TRAY) ×2 IMPLANT
KIT ROOM TURNOVER OR (KITS) ×2 IMPLANT
MANIFOLD NEPTUNE II (INSTRUMENTS) ×2 IMPLANT
NS IRRIG 1000ML POUR BTL (IV SOLUTION) ×2 IMPLANT
PACK ORTHO EXTREMITY (CUSTOM PROCEDURE TRAY) ×2 IMPLANT
PAD ARMBOARD 7.5X6 YLW CONV (MISCELLANEOUS) ×4 IMPLANT
PAD CAST 4YDX4 CTTN HI CHSV (CAST SUPPLIES) IMPLANT
PADDING CAST COTTON 4X4 STRL (CAST SUPPLIES) ×2
SET HNDPC FAN SPRY TIP SCT (DISPOSABLE) IMPLANT
SPLINT PLASTER EXTRA FAST 3X15 (CAST SUPPLIES) ×1
SPLINT PLASTER GYPS XFAST 3X15 (CAST SUPPLIES) IMPLANT
SPONGE GAUZE 4X4 12PLY (GAUZE/BANDAGES/DRESSINGS) ×2 IMPLANT
SPONGE LAP 18X18 X RAY DECT (DISPOSABLE) IMPLANT
STOCKINETTE IMPERVIOUS 9X36 MD (GAUZE/BANDAGES/DRESSINGS) IMPLANT
SUT ETHILON 4 0 PS 2 18 (SUTURE) IMPLANT
SUT MERSILENE 4 0 P 3 (SUTURE) ×1 IMPLANT
SUT VICRYL RAPIDE 4/0 PS 2 (SUTURE) ×1 IMPLANT
TOWEL OR 17X24 6PK STRL BLUE (TOWEL DISPOSABLE) ×2 IMPLANT
TOWEL OR 17X26 10 PK STRL BLUE (TOWEL DISPOSABLE) ×2 IMPLANT
TUBE ANAEROBIC SPECIMEN COL (MISCELLANEOUS) IMPLANT
TUBE CONNECTING 12X1/4 (SUCTIONS) ×2 IMPLANT
UNDERPAD 30X30 INCONTINENT (UNDERPADS AND DIAPERS) ×2 IMPLANT
WATER STERILE IRR 1000ML POUR (IV SOLUTION) ×2 IMPLANT
YANKAUER SUCT BULB TIP NO VENT (SUCTIONS) ×2 IMPLANT

## 2012-10-29 NOTE — Op Note (Signed)
10/29/2012  7:26 PM  PATIENT:  Richard Valdez  32 y.o. male  PRE-OPERATIVE DIAGNOSIS:  Right hand wound, including ring finger, with suspected evolving infection  POST-OPERATIVE DIAGNOSIS:  Complex laceration of right ring finger extensor apparatus, no apparent abscess or purulent fluid  PROCEDURE:  Right ring finger excisional debridement of skin, subcutaneous tissue, and tendon; right long finger excisional debridement of skin; right ring finger extensor tendon repair zones 3 and 4; closure of wounds right ring and long finger measuring 10 cm  SURGEON: Cliffton Asters. [name], MD  PHYSICIAN ASSISTANT: None  ANESTHESIA:  general  SPECIMENS:  None  DRAINS:   None  PREOPERATIVE INDICATIONS:  Richard Valdez is a  32 y.o. male with a diagnosis of traumatic wounds to the right hand ring and long fingers with suspected extensor tendon injury and possible early abscess formation  The risks benefits and alternatives were discussed with the patient preoperatively including but not limited to the risks of infection, bleeding, nerve injury, cardiopulmonary complications, the need for revision surgery, among others, and the patient verbalized understanding and consented to proceed.  OPERATIVE IMPLANTS: None  OPERATIVE FINDINGS: The injury to the right ring finger rays the proximally based flap over the dorsum of the digit, the apex of which was over the PIP joint actually involve a small portion of the base of the middle phalanx leaving an open PIP joint. The extensor apparatus was lifted with this, almost as if a formal Chamay approach had been performed surgically.  The long finger volar wound was opened, debrided, irrigated, and closed.  OPERATIVE PROCEDURE:  The patient was escorted to the operative theatre and placed in a supine position.  A surgical "time-out" was performed during which the planned procedure, proposed operative site, and the correct patient identity were compared to the  operative consent and agreement confirmed by the circulating nurse according to current facility policy.  Following application of a tourniquet to the operative extremity, the exposed skin was scrubbed with a Hibiclens scrub brush and nail cleaner before being formally prepped with Chloraprep and draped in the usual sterile fashion.  The limb was exsanguinated with an Esmarch bandage and the tourniquet inflated to approximately higher than systolic BP.  The sutures were removed. Both wounds were copiously irrigated. No periodic fluid or other pus was obtained. There appeared been no infection present in the wounds are spreading from it. The extensor tendon was dissected out from the dorsal flap of the ring finger there was even a small fleck of bone attached to it that had been lifted from the middle phalanx. This was excised. The skin margins were excised and a small portion of tendon also excised. The wound and joint were copiously irrigated. The remainder of the articular cartilage at the PIP joint appeared unremarkable. The tendon was repaired with 40 Mersilene sutures in a combination of interrupted and running. There appeared to be enough distal tendon to allow it to be repaired without any suture anchors. Once the tendon was repaired, the wounds were again copiously irrigated and the skin was closed with 4-0 Vicryl Rapide interrupted sutures. Local anesthesia was instilled at the base of both digits as a digital block. This was half percent Marcaine without epinephrine. A bulky dressing was applied with the hand placed in a safe position with the MP joints flexed and the IP joint extended. He was awakened and taken to recovery in stable condition  DISPOSITION: The patient will be returned to the floor  in an observation status for another 2 doses of IV antibiotics before discharge. He will followup next week with me.
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
## 2012-10-29 NOTE — Progress Notes (Signed)
32yo male c/o wounds on middle and ring fingers on car fan blade, had been sutured but now more swollen w/ severe pain, has been taking PO ABX since Friday, now to begin IV ABX s/p I&D and repair.  Will start clindamycin 600mg  IV Q8H and monitor clinical progression.  Vernard Gambles, PharmD, BCPS 10/29/2012 11:01 PM

## 2012-10-29 NOTE — ED Notes (Addendum)
Patient here for wound recheck. Patient cut right ring and middle finger on car fan blade, had sutures placed. Per patient took bandages off and hand was swollen with redness and severe pain. Hand slightly red, swelling noted in hand and wrist. No drainage.Patient reports taking oral antibiotics and ultram since Friday. Reports taking Advil with no relief.

## 2012-10-29 NOTE — H&P (Signed)
Richard Valdez is an 32 y.o. male.   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  He was working on a vehicle, and his hand slipped into the clutch than.  This created a wound on the dorsum of the long finger and also an adjacent wound on the ring finger.  It has been noted that there was some injury to the extensor apparatus of the ring finger.  He was discharged on Keflex.  He made an appointment yesterday with Dr. Izora Ribas, but went back to the [facility] emergency room today because of increased pain and swelling.  He has received a dose of IV antibiotics (clindamycin) and presents today for further evaluation and management.  He reports that his pain is only marginally controllable and he has been n.p.o. since this morning.  No past medical history on file.  Past Surgical History  Procedure Laterality Date  . Hernia repair      No family history on file. Social History:  reports that he has been smoking Cigarettes.  He has a 36 pack-year smoking history. He has never used smokeless tobacco. He reports that  drinks alcohol. He reports that he does not use illicit drugs.  Allergies: No Known Allergies  No prescriptions prior to admission    Results for orders placed during the hospital encounter of 10/29/12 (from the past 48 hour(s))  CBC WITH DIFFERENTIAL     Status: Abnormal   Collection Time    10/29/12  8:39 AM      Result Value Range   WBC 8.3  4.0 - 10.5 K/uL   RBC 4.08 (*) 4.22 - 5.81 MIL/uL   Hemoglobin 13.6  13.0 - 17.0 g/dL   HCT 56.2  13.0 - 86.5 %   MCV 97.5  78.0 - 100.0 fL   MCH 33.3  26.0 - 34.0 pg   MCHC 34.2  30.0 - 36.0 g/dL   RDW 78.4  69.6 - 29.5 %   Platelets 239  150 - 400 K/uL   Neutrophils Relative % 72  43 - 77 %   Neutro Abs 6.0  1.7 - 7.7 K/uL   Lymphocytes Relative 22  12 - 46 %   Lymphs Abs 1.8  0.7 - 4.0 K/uL   Monocytes Relative 6  3 - 12 %   Monocytes  Absolute 0.5  0.1 - 1.0 K/uL   Eosinophils Relative 1  0 - 5 %   Eosinophils Absolute 0.0  0.0 - 0.7 K/uL   Basophils Relative 0  0 - 1 %   Basophils Absolute 0.0  0.0 - 0.1 K/uL  BASIC METABOLIC PANEL     Status: Abnormal   Collection Time    10/29/12  8:39 AM      Result Value Range   Sodium 141  135 - 145 mEq/L   Potassium 3.2 (*) 3.5 - 5.1 mEq/L   Chloride 106  96 - 112 mEq/L   CO2 27  19 - 32 mEq/L   Glucose, Bld 129 (*) 70 - 99 mg/dL   BUN 9  6 - 23 mg/dL   Creatinine, Ser 2.84  0.50 - 1.35 mg/dL   Calcium 9.3  8.4 - 13.2 mg/dL   GFR calc non Af Amer >90  >90 mL/min   GFR calc Af Amer >90  >90 mL/min   Comment:  The eGFR has been calculated     using the CKD EPI equation.     This calculation has not been     validated in all clinical     situations.     eGFR's persistently     <90 mL/min signify     possible Chronic Kidney Disease.   No results found.  Review of Systems  All other systems reviewed and are negative.    There were no vitals taken for this visit. Physical Exam  Vitals: Refer to EMR. Constitutional:  WD, WN, NAD HEENT:  NCAT, EOMI Neuro/Psych:  Alert & oriented to person, place, and time; appropriate mood & affect Lymphatic: No generalized UE edema or lymphadenopathy Extremities / MSK:  Both UE are normal with respect to appearance, ranges of motion, joint stability, muscle strength/tone, sensation, & perfusion except as otherwise noted:  The right hand has lacerations on the ring finger that comes to an apex of the V just distal to the PIP joint dorsally.  Adjacent to the long finger is the short limb in the long limb traverses all the way into the dorsum of the hand in the web space.  He has a wound on the adjacent long finger volar radially.  His hand is a little swollen, little reddened, but he has pain with passive movement of the long, ring, and small fingers.  Passive movement of the long and small finger hurts the ring finger.  He  even has pain on the volar base of the ring finger away from any actual laceration.  He is unable to actively extend the PIP joint.  Labs / Xrays:  No radiographic studies obtained today.  Assessment: 4 days following open wound to right hand and fingers, possibly with evolving infection, and with unclear extent of damage to the extensor apparatus  Plan:  I discussed these findings with the patient and recommended that we proceed to the operating room today for irrigation, debridement, and further evaluation of the extensor apparatus.  We might possibly repair if indicated and the wound appears appropriate.  This will be done today as an add-on he is sent to short stay.  The details of the operative procedure were discussed with the patient.  Questions were invited and answered.  In addition to the goal of the procedure, the risks of the procedure to include but not limited to bleeding; infection; damage to the nerves or blood vessels that could result in bleeding, numbness, weakness, chronic pain, and the need for additional procedures; stiffness; the need for revision surgery; and anesthetic risks, the worst of which is death, were reviewed.  No specific outcome was guaranteed or implied.  Informed consent was obtained.     Amante Fomby A. 10/29/2012, 1:47 PM

## 2012-10-29 NOTE — Preoperative (Signed)
Beta Blockers   Reason not to administer Beta Blockers:Not Applicable 

## 2012-10-29 NOTE — Anesthesia Preprocedure Evaluation (Signed)
Anesthesia Evaluation  Patient identified by MRN, date of birth, ID band Patient awake    Reviewed: Allergy & Precautions, H&P , NPO status , Patient's Chart, lab work & pertinent test results  Airway Mallampati: II TM Distance: >3 FB Neck ROM: Full    Dental no notable dental hx. (+) Dental Advisory Given and Partial Upper   Pulmonary neg pulmonary ROS,  breath sounds clear to auscultation  Pulmonary exam normal       Cardiovascular negative cardio ROS  Rhythm:Regular Rate:Normal     Neuro/Psych negative neurological ROS  negative psych ROS   GI/Hepatic negative GI ROS, Neg liver ROS,   Endo/Other  negative endocrine ROS  Renal/GU negative Renal ROS  negative genitourinary   Musculoskeletal   Abdominal   Peds  Hematology negative hematology ROS (+)   Anesthesia Other Findings   Reproductive/Obstetrics negative OB ROS                           Anesthesia Physical Anesthesia Plan  ASA: I  Anesthesia Plan: General   Post-op Pain Management:    Induction: Intravenous  Airway Management Planned: LMA  Additional Equipment:   Intra-op Plan:   Post-operative Plan: Extubation in OR  Informed Consent: I have reviewed the patients History and Physical, chart, labs and discussed the procedure including the risks, benefits and alternatives for the proposed anesthesia with the patient or authorized representative who has indicated his/her understanding and acceptance.   Dental advisory given  Plan Discussed with: CRNA  Anesthesia Plan Comments:         Anesthesia Quick Evaluation

## 2012-10-29 NOTE — Anesthesia Postprocedure Evaluation (Signed)
  Anesthesia Post-op Note  Patient: Richard Valdez  Procedure(s) Performed: Procedure(s): Excisional DEBRIDEMENT with Extensor Tendon Repair Right Long and Ring Fingers (Right)  Patient Location: PACU  Anesthesia Type:General  Level of Consciousness: awake, alert  and oriented  Airway and Oxygen Therapy: Patient Spontanous Breathing  Post-op Pain: mild  Post-op Assessment: Post-op Vital signs reviewed, Patient's Cardiovascular Status Stable, Respiratory Function Stable, Patent Airway and No signs of Nausea or vomiting  Post-op Vital Signs: Reviewed and stable  Complications: No apparent anesthesia complications

## 2012-10-29 NOTE — Transfer of Care (Signed)
Immediate Anesthesia Transfer of Care Note  Patient: Richard Valdez  Procedure(s) Performed: Procedure(s): Excisional DEBRIDEMENT with Extensor Tendon Repair Right Long and Ring Fingers (Right)  Patient Location: PACU  Anesthesia Type:General  Level of Consciousness: awake  Airway & Oxygen Therapy: Patient Spontanous Breathing and Patient connected to nasal cannula oxygen  Post-op Assessment: Report given to PACU RN and Post -op Vital signs reviewed and stable  Post vital signs: Reviewed and stable  Complications: No apparent anesthesia complications

## 2012-10-30 ENCOUNTER — Encounter (HOSPITAL_COMMUNITY): Payer: Self-pay | Admitting: Orthopedic Surgery

## 2012-10-30 NOTE — ED Provider Notes (Addendum)
Medical screening examination/treatment/procedure(s) were conducted as a shared visit with non-physician practitioner(s) and myself.  I personally evaluated the patient during the encounter.  Evaluated and examined. Sutures are intact. There is significant swelling of the finger, however, with erythema on the dorsal aspect of the hand. There does not appear to be any flexor region infection, but this is concerning for wound infection. Patient will need immediate evaluation by hand surgeon.     Gilda Crease, MD 10/30/12 1610  Gilda Crease, MD 10/30/12 [phone]
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
## 2012-11-11 ENCOUNTER — Ambulatory Visit: Payer: Self-pay | Attending: Orthopedic Surgery | Admitting: Occupational Therapy

## 2012-11-11 DIAGNOSIS — M25549 Pain in joints of unspecified hand: Secondary | ICD-10-CM | POA: Insufficient documentation

## 2012-11-11 DIAGNOSIS — M6281 Muscle weakness (generalized): Secondary | ICD-10-CM | POA: Insufficient documentation

## 2012-11-11 DIAGNOSIS — M25649 Stiffness of unspecified hand, not elsewhere classified: Secondary | ICD-10-CM | POA: Insufficient documentation

## 2012-11-11 DIAGNOSIS — IMO0001 Reserved for inherently not codable concepts without codable children: Secondary | ICD-10-CM | POA: Insufficient documentation

## 2012-11-13 ENCOUNTER — Ambulatory Visit: Payer: Self-pay | Admitting: Occupational Therapy

## 2012-11-19 ENCOUNTER — Ambulatory Visit: Payer: Self-pay | Admitting: Occupational Therapy

## 2012-11-26 ENCOUNTER — Ambulatory Visit: Payer: Self-pay | Attending: Orthopedic Surgery | Admitting: Occupational Therapy

## 2012-11-26 DIAGNOSIS — M25649 Stiffness of unspecified hand, not elsewhere classified: Secondary | ICD-10-CM | POA: Insufficient documentation

## 2012-11-26 DIAGNOSIS — IMO0001 Reserved for inherently not codable concepts without codable children: Secondary | ICD-10-CM | POA: Insufficient documentation

## 2012-11-26 DIAGNOSIS — M25549 Pain in joints of unspecified hand: Secondary | ICD-10-CM | POA: Insufficient documentation

## 2012-11-26 DIAGNOSIS — M6281 Muscle weakness (generalized): Secondary | ICD-10-CM | POA: Insufficient documentation

## 2012-12-10 ENCOUNTER — Ambulatory Visit: Payer: Self-pay | Admitting: Occupational Therapy

## 2012-12-17 ENCOUNTER — Ambulatory Visit: Payer: Self-pay | Admitting: Occupational Therapy

## 2012-12-19 ENCOUNTER — Emergency Department (HOSPITAL_COMMUNITY): Payer: Self-pay

## 2012-12-19 ENCOUNTER — Emergency Department (HOSPITAL_COMMUNITY)
Admission: EM | Admit: 2012-12-19 | Discharge: 2012-12-19 | Disposition: A | Discharge status: Home / Self Care | Payer: Self-pay | Attending: Emergency Medicine | Admitting: Emergency Medicine

## 2012-12-19 ENCOUNTER — Encounter (HOSPITAL_COMMUNITY): Payer: Self-pay | Admitting: *Deleted

## 2012-12-19 DIAGNOSIS — F17200 Nicotine dependence, unspecified, uncomplicated: Secondary | ICD-10-CM | POA: Insufficient documentation

## 2012-12-19 DIAGNOSIS — Z791 Long term (current) use of non-steroidal anti-inflammatories (NSAID): Secondary | ICD-10-CM | POA: Insufficient documentation

## 2012-12-19 DIAGNOSIS — D72829 Elevated white blood cell count, unspecified: Secondary | ICD-10-CM | POA: Insufficient documentation

## 2012-12-19 DIAGNOSIS — R55 Syncope and collapse: Secondary | ICD-10-CM | POA: Insufficient documentation

## 2012-12-19 LAB — CBC WITH DIFFERENTIAL/PLATELET
Eosinophils Absolute: 0 10*3/uL (ref 0.0–0.7)
Hemoglobin: 15.4 g/dL (ref 13.0–17.0)
Lymphs Abs: 2.7 10*3/uL (ref 0.7–4.0)
MCH: 33.6 pg (ref 26.0–34.0)
Monocytes Relative: 6 % (ref 3–12)
Neutro Abs: 17.4 10*3/uL — ABNORMAL HIGH (ref 1.7–7.7)
Neutrophils Relative %: 81 % — ABNORMAL HIGH (ref 43–77)
Platelets: 253 10*3/uL (ref 150–400)
RBC: 4.59 MIL/uL (ref 4.22–5.81)
WBC: 21.4 10*3/uL — ABNORMAL HIGH (ref 4.0–10.5)

## 2012-12-19 LAB — BASIC METABOLIC PANEL
BUN: 10 mg/dL (ref 6–23)
Chloride: 103 mEq/L (ref 96–112)
GFR calc Af Amer: >90 mL/min (ref >90)
GFR calc non Af Amer: >90 mL/min (ref >90)
Potassium: 3.4 mEq/L — ABNORMAL LOW (ref 3.5–5.1)
Sodium: 141 mEq/L (ref 135–145)

## 2012-12-19 LAB — URINALYSIS, ROUTINE W REFLEX MICROSCOPIC
Leukocytes, UA: NEGATIVE
Nitrite: NEGATIVE
Specific Gravity, Urine: >1.03 — ABNORMAL HIGH (ref 1.005–1.030)
Urobilinogen, UA: 0.2 mg/dL (ref 0.0–1.0)
pH: 5.5 (ref 5.0–8.0)

## 2012-12-19 MED ORDER — SODIUM CHLORIDE 0.9 % IV BOLUS (SEPSIS)
1000.0000 mL | Freq: Once | INTRAVENOUS | Status: AC
  Administered 2012-12-19: 1000 mL via INTRAVENOUS

## 2012-12-19 MED ORDER — OXYCODONE-ACETAMINOPHEN 5-325 MG PO TABS: 1.0000 | ORAL_TABLET | Freq: Once | ORAL | Status: DC

## 2012-12-19 NOTE — ED Provider Notes (Signed)
Scribed for Richard Roller, MD, the patient was seen in room APA04/APA04. This chart was scribed by Richard Valdez, ED scribe. Patient's care was started at 1639  CSN: [id number]     Arrival date & time 12/19/12  1620 History   First MD Initiated Contact with Patient 12/19/12 1633     Chief Complaint  Patient presents with  . Loss of Consciousness   (Consider location/radiation/quality/duration/timing/severity/associated sxs/prior Treatment) The history is provided by the patient, the EMS personnel, the spouse and a significant other.   HPI Comments: Richard Valdez is a 32 y.o. 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 Reports normal appetite and fluid intake today. Reports associated witnessed LOC while he was walking into the parking lot at work. Denies feeling lightheaded or dizzy prior to syncopal episode. Reports associated inner lip laceration and constant moderate right hand pain from previous injury. Reports right hand pain is exacerbated by touch and movement and alleviated by prescribed oxycodone. Denies associated fever, chills, neck pain, chest pain, shortness of breath, and back pain. Reports dizziness when he takes prescribes oxycodone, but denies taking it today. Denies hx of seizures and any significant PMH. Denies any unexpected family deaths. Reports he is a heavy cigarette smoker. Denies illicit drug use. Reports his right hand injury was previously evaluated by Dr. [name] and imaging was unremarkable. Per girlfriend pt took eight 5 mg tablets of oxycodone today.   History reviewed. No pertinent past medical history. Past Surgical History  Procedure Laterality Date  . Hernia repair    . I&d extremity Right 10/29/2012    Procedure: Excisional DEBRIDEMENT with Extensor Tendon Repair Right Long and Ring  Fingers;  Surgeon: [name], MD;  Location[facility] OR;  Service: Orthopedics;  Laterality: Right;   Family History  Problem Relation Age of Onset  . Hypertension Mother   . Hypertension Father    History  Substance Use Topics  . Smoking status: Current Every Day Smoker -- 2.00 packs/day for 18 years    Types: Cigarettes  . Smokeless tobacco: Never Used     Comment: last time for marijuana was over 1 year ago.  . Alcohol Use: Yes     Comment: occasional not weekly    Review of Systems  HENT: Negative for neck pain.   Respiratory: Negative for shortness of breath.   Cardiovascular: Negative for chest pain.  Musculoskeletal: Negative for back pain.  Skin: Positive for wound.  Neurological: Positive for syncope.  Psychiatric/Behavioral: Negative for confusion.  All other systems reviewed and are negative.    Allergies  Review of patient's allergies indicates no known allergies.  Home Medications   Current Outpatient Rx  Name  Route  Sig  Dispense  Refill  . naproxen (NAPROSYN) 500 MG tablet   Oral   Take 1 tablet (500 mg total) by mouth 2 (two) times daily with a meal.   60 tablet   1   . oxyCODONE (ROXICODONE) 5 MG immediate release tablet   Oral   Take 1-3 tablets (5-15 mg total) by mouth every 4 (four) hours as needed for pain.   80 tablet   0    BP 124/89  Pulse 119  Temp(Src) 99 F (37.2 C) (Oral)  Resp 20  Ht 5\' 6"  (1.676 m)  Wt 180 lb (81.647 kg)  BMI  29.07 kg/m2  SpO2 97% Physical Exam  Nursing note and vitals reviewed. Constitutional: He is oriented to person, place, and time. He appears well-developed and well-nourished. No distress.  HENT:  Head: Normocephalic and atraumatic.  0.5 cm inner lip laceration in the upper right quadrant of is mouth  Eyes: EOM are normal.  Neck: Neck supple. No tracheal deviation present.  Cardiovascular: Normal rate, regular rhythm and normal heart sounds.   No murmur heard. Cap refill < 3 seconds    Pulmonary/Chest: Effort normal and breath sounds normal. No respiratory distress.  Abdominal: Soft.  Musculoskeletal: Normal range of motion. He exhibits tenderness.       Cervical back: Normal.       Thoracic back: Normal.       Lumbar back: Normal.       Right hand: He exhibits tenderness (moderate) and swelling (moderate ).  No midline tenderness. Moderate diffuse tenderness over 4th digit of right hand.   Neurological: He is alert and oriented to person, place, and time.  Skin: Skin is warm and dry.  Small abrasions over the PIP of the right hand pinky and the PIP of the fourth digit of the right hand.   Psychiatric: He has a normal mood and affect. His behavior is normal.    ED Course  Procedures (including critical care time) Medications  oxyCODONE-acetaminophen (PERCOCET/ROXICET) 5-325 MG per tablet 1 tablet (0 tablets Oral Hold 12/19/12 1716)  sodium chloride 0.9 % bolus 1,000 mL (0 mLs Intravenous Stopped 12/19/12 1730)    Labs Review Labs Reviewed  BASIC METABOLIC PANEL - Abnormal; Notable for the following:    Potassium 3.4 (*)    Glucose, Bld 111 (*)    All other components within normal limits  URINALYSIS, ROUTINE W REFLEX MICROSCOPIC - Abnormal; Notable for the following:    Specific Gravity, Urine >1.030 (*)    All other components within normal limits  CBC WITH DIFFERENTIAL - Abnormal; Notable for the following:    WBC 21.4 (*)    Neutrophils Relative % 81 (*)    Neutro Abs 17.4 (*)    Monocytes Absolute 1.3 (*)    All other components within normal limits   Imaging Review Dg Hand Complete Right  12/19/2012   CLINICAL DATA:  Fall. Right hand abrasions.  EXAM: RIGHT HAND - COMPLETE 3+ VIEW  COMPARISON:  10/26/2012  FINDINGS: He right ring finger is emission a mildly fixed flexed position. Mild diffuse soft tissue swelling. Small radiopaque density projects over the posterior soft tissues between the 4th and 5th proximal metacarpals. No fracture, subluxation or  dislocation.  IMPRESSION: No fracture. Small radiopaque foreign body within the soft tissues between the 4th and 5th digits as above.   Electronically Signed   By: Charlett Nose   On: 12/19/2012 17:25    MDM   1. Syncope   2. Leukocytosis    The patient has had recurrent syncopal episodes in the past, according to the paramedics and from those who are present at the scene these usually happen after the patient has had his pain medications. He takes Roxicodone without Tylenol. This was again recently prescribed and was given 80 tablet secondary to the pain in his finger. He tells me that this injury was nonsurgical, there are no fractures that it continues to be mildly swollen. He has no focal findings on his exam to suggest a source for the syncopal episode. His EKG is unremarkable, is completely unchanged from prior EKGs. His vital  signs appear stable, he answers all my questions appropriately, he has been either evasive about his use of his pain medication and states that he told his girlfriend he had taken 8 tablets of the pain medication today but tells me that he was lying to his girlfriend. He denies using this medication to be.  ED ECG REPORT  I personally interpreted this EKG   Date: 12/19/2012   Rate: 96  Rhythm: normal sinus rhythm  QRS Axis: normal  Intervals: normal  ST/T Wave abnormalities: normal  Conduction Disutrbances:none  Narrative Interpretation:   Old EKG Reviewed: Compared with 10/29/2012, no changes, no WPW  Pt has had no sx since arrival - he has an isolated WBC which may be high related to the fall / syncope.  He has no fever, no tachycardia on my evaluation, no headache or neuro sx and states the only pain he has is in his finger which according to my evaluation with imaging still shows no fractures.  He has a normal ECG without acute findings or arrhythmia, UA without infection but with signs of dehydration - Bolus Given.  The patient now informs me that he did in  fact take 8 of the 5 mg Roxicodone's earlier in the day. The do not contain Tylenol, he has had no symptoms throughout his stay. The only explanation that I have for his leukocytosis at this time is the syncopal episode. He has no symptoms, no chest pain, no shortness of breath, no headache and no other symptoms on discharge. He has been given a followup list of resources, he has been informed of his leukocytosis, he has expressed his understanding to the indications for return. I have recommended that he stop his Roxicodone  I personally performed the services described in this documentation, which was scribed in my presence. The recorded information has been reviewed and is accurate.      Richard Roller, MD 12/19/12 [place]

## 2012-12-19 NOTE — ED Notes (Signed)
Holding percocet per EDP

## 2012-12-19 NOTE — ED Notes (Signed)
Pt denies taking any pain medicine today to EDP, states he told his GF he took 8 but he denies actually taking them.

## 2012-12-19 NOTE — ED Notes (Signed)
Discharge instructions reviewed with pt, questions answered. Pt verbalized understanding.  
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
## 2013-08-12 ENCOUNTER — Encounter (HOSPITAL_COMMUNITY): Payer: Self-pay | Admitting: Emergency Medicine

## 2013-08-12 ENCOUNTER — Emergency Department (HOSPITAL_COMMUNITY)
Admission: EM | Admit: 2013-08-12 | Discharge: 2013-08-12 | Disposition: A | Discharge status: Home / Self Care | Payer: Self-pay | Attending: Emergency Medicine | Admitting: Emergency Medicine

## 2013-08-12 ENCOUNTER — Emergency Department (HOSPITAL_COMMUNITY): Payer: Self-pay

## 2013-08-12 DIAGNOSIS — N509 Disorder of male genital organs, unspecified: Secondary | ICD-10-CM | POA: Insufficient documentation

## 2013-08-12 DIAGNOSIS — Z791 Long term (current) use of non-steroidal anti-inflammatories (NSAID): Secondary | ICD-10-CM | POA: Insufficient documentation

## 2013-08-12 DIAGNOSIS — R1033 Periumbilical pain: Secondary | ICD-10-CM | POA: Insufficient documentation

## 2013-08-12 DIAGNOSIS — F17200 Nicotine dependence, unspecified, uncomplicated: Secondary | ICD-10-CM | POA: Insufficient documentation

## 2013-08-12 DIAGNOSIS — N5082 Scrotal pain: Secondary | ICD-10-CM

## 2013-08-12 LAB — URINALYSIS, ROUTINE W REFLEX MICROSCOPIC
BILIRUBIN URINE: NEGATIVE
Glucose, UA: NEGATIVE mg/dL
HGB URINE DIPSTICK: NEGATIVE
KETONES UR: NEGATIVE mg/dL
Leukocytes, UA: NEGATIVE
NITRITE: NEGATIVE
PH: 5.5 (ref 5.0–8.0)
Protein, ur: NEGATIVE mg/dL
Specific Gravity, Urine: <1.005 — ABNORMAL LOW (ref 1.005–1.030)
Urobilinogen, UA: 0.2 mg/dL (ref 0.0–1.0)

## 2013-08-12 MED ORDER — HYDROCODONE-ACETAMINOPHEN 5-325 MG PO TABS
1.0000 | ORAL_TABLET | Freq: Once | ORAL | Status: AC
  Administered 2013-08-12: 1 via ORAL
  Filled 2013-08-12: qty 1

## 2013-08-12 MED ORDER — HYDROCODONE-ACETAMINOPHEN 5-325 MG PO TABS: 1.0000 | ORAL_TABLET | Freq: Four times a day (QID) | ORAL | Status: DC | PRN

## 2013-08-12 NOTE — ED Provider Notes (Signed)
CSN: [id number]     Arrival date & time 08/12/13  0810 History   This chart was scribed for Gerhard Munchobert Willie Plain, MD by Manuela Schwartzaylor Day, ED scribe. This patient was seen in room APA09/APA09 and the patient's care was started at 0810.  Chief Complaint  Patient presents with  . Hernia   The history is provided by the patient. No language interpreter was used.   HPI Comments: Richard Valdez is a 33 y.o. male who presents to the Emergency Department for constant groin/scrotal pain, onset x1 week ago and concerned that he might have a hernia. He states this began when he was lifting a heavy car tire, he works as a Teaching laboratory techniciancar mechanic. He reports a hx of hernia as a child 20 years ago which he states seems similar to this problem. He reports associated mild periumbilical pain but reports that pain in his scrotum is more severe. He used some leftover pain medicine at home and had no relief from pain. He denies emesis, diarrhea or fever.  History reviewed. No pertinent past medical history. Past Surgical History  Procedure Laterality Date  . Hernia repair    . I&d extremity Right 10/29/2012    Procedure: Excisional DEBRIDEMENT with Extensor Tendon Repair Right Long and Ring Fingers;  Surgeon: Jodi Marbleavid A Thompson, MD;  [facility];  Service: Orthopedics;  Laterality: Right;   Family History  Problem Relation Age of Onset  . Hypertension Mother   . Hypertension Father    History  Substance Use Topics  . Smoking status: Current Every Day Smoker -- 2.00 packs/day for 18 years    Types: Cigarettes  . Smokeless tobacco: Never Used     Comment: last time for marijuana was over 1 year ago.  . Alcohol Use: Yes     Comment: occasional not weekly    Review of Systems  Constitutional: Negative for fever and chills.  Respiratory: Negative for cough and shortness of breath.   Cardiovascular: Negative for chest pain.  Gastrointestinal: Negative for nausea and vomiting.  Genitourinary: Positive for testicular pain.   Musculoskeletal: Negative for back pain.  Skin: Negative for rash.  All other systems reviewed and are negative.   Allergies  Review of patient's allergies indicates no known allergies.  Home Medications   Prior to Admission medications   Medication Sig Start Date End Date Taking? Authorizing Provider  naproxen (NAPROSYN) 500 MG tablet Take 1 tablet (500 mg total) by mouth 2 (two) times daily with a meal. 10/29/12   Jodi Marbleavid A Thompson, MD  oxyCODONE (ROXICODONE) 5 MG immediate release tablet Take 1-3 tablets (5-15 mg total) by mouth every 4 (four) hours as needed for pain. 10/29/12   Jodi Marbleavid A Thompson, MD   Triage Vitals: BP 115/81  Pulse 124  Temp(Src) 97.6 F (36.4 C) (Oral)  Resp 22  SpO2 97%  Physical Exam  Nursing note and vitals reviewed. Constitutional: He is oriented to person, place, and time. He appears well-developed and well-nourished. No distress.  HENT:  Head: Normocephalic and atraumatic.  Eyes: Conjunctivae are normal. Right eye exhibits no discharge. Left eye exhibits no discharge.  Neck: Normal range of motion.  Cardiovascular: Normal rate.   Pulmonary/Chest: Effort normal. No respiratory distress.  Genitourinary: Penis normal.  Scrotum pain more at base of right scrotum.  Musculoskeletal: Normal range of motion. He exhibits no edema.  Neurological: He is alert and oriented to person, place, and time.  Skin: Skin is warm and dry.  Psychiatric: He has a  normal mood and affect. Thought content normal.    ED Course  Procedures (including critical care time) DIAGNOSTIC STUDIES: Oxygen Saturation is 97% on room air, normal by my interpretation.    COORDINATION OF CARE: At 920 AM Discussed treatment plan with patient which includes US art/ven flow abd, pelvis, pain medicine, us scrotum, UA. Patient agrees.   Labs Review Labs Reviewed  URINALYSIS, ROUTINE W REFLEX MICROSCOPIC - Abnormal; Notable for the following:    Specific Gravity, Urine <1.005 (*)    All  other components within normal limits    Imaging Review [place] Scrotum  08/12/2013   CLINICAL DATA:  RIGHT TESTICULAR PAIN  EXAM: SCROTAL ULTRASOUND  DOPPLER ULTRASOUND OF THE TESTICLES  TECHNIQUE: Complete ultrasound examination of the testicles, epididymis, and other scrotal structures was performed. Color and spectral Doppler ultrasound were also utilized to evaluate blood flow to the testicles.  COMPARISON:  None.  FINDINGS: Right testicle  Measurements: 4.5 X 2.4 X 2.8 CM. No mass or microlithiasis visualized.  Left testicle  Measurements: 3.8 X 2 X 2.8 CM. No mass or microlithiasis visualized.  Right epididymis:  Normal in size and appearance.  Left epididymis:  Normal in size and appearance.  Hydrocele:  There are bilateral small hydroceles.  Varicocele:  There is a small right varicocele.  Pulsed Doppler interrogation of both testes demonstrates low resistance arterial and venous waveforms bilaterally.  IMPRESSION: 1. No testicular torsion. 2. Small bilateral hydroceles. 3. Small right varicocele.   Electronically Signed   By: Elige KoHetal  Patel   On: 08/12/2013 09:46   [place] Art/ven Flow Abd Pelv Doppler  08/12/2013   CLINICAL DATA:  RIGHT TESTICULAR PAIN  EXAM: SCROTAL ULTRASOUND  DOPPLER ULTRASOUND OF THE TESTICLES  TECHNIQUE: Complete ultrasound examination of the testicles, epididymis, and other scrotal structures was performed. Color and spectral Doppler ultrasound were also utilized to evaluate blood flow to the testicles.  COMPARISON:  None.  FINDINGS: Right testicle  Measurements: 4.5 X 2.4 X 2.8 CM. No mass or microlithiasis visualized.  Left testicle  Measurements: 3.8 X 2 X 2.8 CM. No mass or microlithiasis visualized.  Right epididymis:  Normal in size and appearance.  Left epididymis:  Normal in size and appearance.  Hydrocele:  There are bilateral small hydroceles.  Varicocele:  There is a small right varicocele.  Pulsed Doppler interrogation of both testes demonstrates low resistance arterial and  venous waveforms bilaterally.  IMPRESSION: 1. No testicular torsion. 2. Small bilateral hydroceles. 3. Small right varicocele.   Electronically Signed   By: Elige KoHetal  Patel   On: 08/12/2013 09:46    10:11 AM On repeat exam the patient is calm, in no distress. We discussed return precautions, follow up instructions.  MDM   Patient presents with concern of pain in the retro-scrotal area.  On exam patient is a soft, nontender to abdomen, there is low suspicion for incarceration.  Patient may have a disruption of scar tissue or occult hernia or musculoskeletal pain.  Ultrasound, urinalysis are reassuring.  The patient will follow up with primary care and surgery as an outpatient.  He was started on a course of analgesics.   I personally performed the services described in this documentation, which was scribed in my presence. The recorded information has been reviewed and is accurate.      Gerhard Munchobert Jillyan Plitt, MD 08/12/13 1012

## 2013-08-12 NOTE — ED Notes (Signed)
Pt reports pain in groin area x1 week. Pt states "awhile back I back a hernia behind my scrotum". Pt reports tenderness in area.

## 2013-08-12 NOTE — Discharge Instructions (Signed)
As discussed, it is important that you follow up as soon as possible with your physicians for continued management of your condition. ° °If you develop any new, or concerning changes in your condition, please return to the emergency department immediately. ° ° ° °

## 2013-08-14 ENCOUNTER — Encounter (HOSPITAL_COMMUNITY): Payer: Self-pay | Admitting: Emergency Medicine

## 2013-08-14 ENCOUNTER — Emergency Department (HOSPITAL_COMMUNITY)
Admission: EM | Admit: 2013-08-14 | Discharge: 2013-08-14 | Disposition: A | Discharge status: Home / Self Care | Payer: Self-pay | Attending: Emergency Medicine | Admitting: Emergency Medicine

## 2013-08-14 ENCOUNTER — Emergency Department (HOSPITAL_COMMUNITY): Payer: Self-pay

## 2013-08-14 DIAGNOSIS — F17200 Nicotine dependence, unspecified, uncomplicated: Secondary | ICD-10-CM | POA: Insufficient documentation

## 2013-08-14 DIAGNOSIS — I861 Scrotal varices: Secondary | ICD-10-CM | POA: Insufficient documentation

## 2013-08-14 DIAGNOSIS — N433 Hydrocele, unspecified: Secondary | ICD-10-CM | POA: Insufficient documentation

## 2013-08-14 DIAGNOSIS — R109 Unspecified abdominal pain: Secondary | ICD-10-CM | POA: Insufficient documentation

## 2013-08-14 DIAGNOSIS — R1030 Lower abdominal pain, unspecified: Secondary | ICD-10-CM

## 2013-08-14 DIAGNOSIS — Z9889 Other specified postprocedural states: Secondary | ICD-10-CM | POA: Insufficient documentation

## 2013-08-14 DIAGNOSIS — Z79899 Other long term (current) drug therapy: Secondary | ICD-10-CM | POA: Insufficient documentation

## 2013-08-14 LAB — CBC WITH DIFFERENTIAL/PLATELET
BASOS PCT: 0 % (ref 0–1)
Basophils Absolute: 0 10*3/uL (ref 0.0–0.1)
EOS ABS: 0.1 10*3/uL (ref 0.0–0.7)
EOS PCT: 1 % (ref 0–5)
HCT: 45.3 % (ref 39.0–52.0)
HEMOGLOBIN: 15.7 g/dL (ref 13.0–17.0)
Lymphocytes Relative: 22 % (ref 12–46)
Lymphs Abs: 3.4 10*3/uL (ref 0.7–4.0)
MCH: 33.1 pg (ref 26.0–34.0)
MCHC: 34.7 g/dL (ref 30.0–36.0)
MCV: 95.4 fL (ref 78.0–100.0)
MONO ABS: 0.8 10*3/uL (ref 0.1–1.0)
MONOS PCT: 5 % (ref 3–12)
NEUTROS PCT: 72 % (ref 43–77)
Neutro Abs: 11.4 10*3/uL — ABNORMAL HIGH (ref 1.7–7.7)
Platelets: 285 10*3/uL (ref 150–400)
RBC: 4.75 MIL/uL (ref 4.22–5.81)
RDW: 12.6 % (ref 11.5–15.5)
WBC: 15.7 10*3/uL — ABNORMAL HIGH (ref 4.0–10.5)

## 2013-08-14 LAB — COMPREHENSIVE METABOLIC PANEL
ALBUMIN: 4.1 g/dL (ref 3.5–5.2)
ALT: 16 U/L (ref 0–53)
AST: 21 U/L (ref 0–37)
Alkaline Phosphatase: 79 U/L (ref 39–117)
BUN: 8 mg/dL (ref 6–23)
CALCIUM: 9.7 mg/dL (ref 8.4–10.5)
CO2: 27 mEq/L (ref 19–32)
CREATININE: 0.69 mg/dL (ref 0.50–1.35)
Chloride: 103 mEq/L (ref 96–112)
GFR calc non Af Amer: >90 mL/min (ref >90)
GLUCOSE: 90 mg/dL (ref 70–99)
POTASSIUM: 4.5 meq/L (ref 3.7–5.3)
Sodium: 143 mEq/L (ref 137–147)
TOTAL PROTEIN: 7.9 g/dL (ref 6.0–8.3)
Total Bilirubin: 0.3 mg/dL (ref 0.3–1.2)

## 2013-08-14 LAB — URINALYSIS, ROUTINE W REFLEX MICROSCOPIC
Bilirubin Urine: NEGATIVE
GLUCOSE, UA: NEGATIVE mg/dL
Hgb urine dipstick: NEGATIVE
Ketones, ur: NEGATIVE mg/dL
LEUKOCYTES UA: NEGATIVE
NITRITE: NEGATIVE
PH: 6.5 (ref 5.0–8.0)
Protein, ur: NEGATIVE mg/dL
SPECIFIC GRAVITY, URINE: 1.034 — AB (ref 1.005–1.030)
Urobilinogen, UA: 0.2 mg/dL (ref 0.0–1.0)

## 2013-08-14 MED ORDER — IBUPROFEN 600 MG PO TABS: 600.0000 mg | ORAL_TABLET | Freq: Four times a day (QID) | ORAL | Status: DC | PRN

## 2013-08-14 MED ORDER — KETOROLAC TROMETHAMINE 30 MG/ML IJ SOLN
30.0000 mg | Freq: Once | INTRAMUSCULAR | Status: AC
  Administered 2013-08-14: 30 mg via INTRAVENOUS
  Filled 2013-08-14: qty 1

## 2013-08-14 MED ORDER — ONDANSETRON HCL 4 MG/2ML IJ SOLN
4.0000 mg | Freq: Once | INTRAMUSCULAR | Status: AC
  Administered 2013-08-14: 4 mg via INTRAVENOUS
  Filled 2013-08-14: qty 2

## 2013-08-14 MED ORDER — IOHEXOL 300 MG/ML  SOLN
100.0000 mL | Freq: Once | INTRAMUSCULAR | Status: AC | PRN
  Administered 2013-08-14: 100 mL via INTRAVENOUS

## 2013-08-14 MED ORDER — IOHEXOL 300 MG/ML  SOLN
25.0000 mL | INTRAMUSCULAR | Status: AC
  Administered 2013-08-14: 25 mL via ORAL

## 2013-08-14 MED ORDER — HYDROMORPHONE HCL PF 1 MG/ML IJ SOLN
1.0000 mg | Freq: Once | INTRAMUSCULAR | Status: AC
  Administered 2013-08-14: 1 mg via INTRAVENOUS
  Filled 2013-08-14: qty 1

## 2013-08-14 MED ORDER — OXYCODONE-ACETAMINOPHEN 5-325 MG PO TABS: 1.0000 | ORAL_TABLET | Freq: Four times a day (QID) | ORAL | Status: DC | PRN

## 2013-08-14 NOTE — ED Notes (Signed)
The pt is c/o groin pain for 2 weeks no urinary symptoms

## 2013-08-14 NOTE — ED Provider Notes (Signed)
CSN: [id number]     Arrival date & time 08/14/13  1732 History   First MD Initiated Contact with Patient 08/14/13 1916     Chief Complaint  Patient presents with  . Groin Pain     (Consider location/radiation/quality/duration/timing/severity/associated sxs/prior Treatment) HPI Comments: Patient with history of inguinal hernia repair as a child -- presents with a complaint of groin pain for the past 2 weeks. Patient denies injury, although admits to doing heavy lifting at work. He was seen at [facility] in 2 days ago and had an ultrasound which demonstrated small hydroceles and a right varicocele. He was treated with hydrocodone which he has been taking and this has not helped. Patient denies fever, abdominal pain, vomiting, dysuria, hematuria, penile discharge. No change in stools no pain with bowel movements. Onset of symptoms gradual. Course is constant. Palpation and movement makes the pain worse. Nothing makes it better.  Patient is a 33 y.o. male presenting with groin pain. The history is provided by the patient and medical records.  Groin Pain Pertinent negatives include no abdominal pain, chest pain, coughing, fever, headaches, myalgias, nausea, rash, sore throat or vomiting.    History reviewed. No pertinent past medical history. Past Surgical History  Procedure Laterality Date  . Hernia repair    . I&d extremity Right 10/29/2012    Procedure: Excisional DEBRIDEMENT with Extensor Tendon Repair Right Long and Ring Fingers;  Surgeon: [name], MD;  Location: [facility] OR;  Service: Orthopedics;  Laterality: Right;   Family History  Problem Relation Age of Onset  . Hypertension Mother   . Hypertension Father    History  Substance Use Topics  . Smoking status: Current Every Day Smoker -- 2.00 packs/day for 18 years    Types: Cigarettes  . Smokeless tobacco: Never Used     Comment: last time for marijuana was over 1 year ago.  . Alcohol Use: Yes     Comment: occasional not  weekly    Review of Systems  Constitutional: Negative for fever.  HENT: Negative for rhinorrhea and sore throat.   Eyes: Negative for redness.  Respiratory: Negative for cough.   Cardiovascular: Negative for chest pain.  Gastrointestinal: Negative for nausea, vomiting, abdominal pain and diarrhea.  Genitourinary: Positive for testicular pain. Negative for dysuria, frequency, hematuria, penile swelling and scrotal swelling.  Musculoskeletal: Negative for myalgias.  Skin: Negative for rash.  Neurological: Negative for headaches.    Allergies  Review of patient's allergies indicates no known allergies.  Home Medications   Prior to Admission medications   Medication Sig Start Date End Date Taking? Authorizing Provider  acetaminophen (TYLENOL) 500 MG tablet Take 1,000 mg by mouth daily as needed for moderate pain.    Historical Provider, MD  HYDROcodone-acetaminophen (NORCO/VICODIN) 5-325 MG per tablet Take 1 tablet by mouth every 6 (six) hours as needed for moderate pain. 08/12/13   Gerhard Munch, MD   BP 143/82  Pulse 90  Temp(Src) 98.3 F (36.8 C) (Oral)  Resp 18  Ht 5\' 7"  (1.702 m)  Wt 170 lb (77.111 kg)  BMI 26.62 kg/m2  SpO2 98%  Physical Exam  Nursing note and vitals reviewed. Constitutional: He appears well-developed and well-nourished.  HENT:  Head: Normocephalic and atraumatic.  Eyes: Conjunctivae are normal. Right eye exhibits no discharge. Left eye exhibits no discharge.  Neck: Normal range of motion. Neck supple.  Cardiovascular: Normal rate, regular rhythm and normal heart sounds.   Pulmonary/Chest: Effort normal and breath sounds normal.  Abdominal: Soft. There is no tenderness.  Genitourinary: Right testis shows tenderness. Right testis shows no mass and no swelling. No penile tenderness. No discharge found.  Exam is tough as patient has severe pain with palpation near inguinal canal to the point where he almost vomits. No obvious hernia but exam is limited.    Neurological: He is alert.  Skin: Skin is warm and dry.  Psychiatric: He has a normal mood and affect.    ED Course  Procedures (including critical care time) Labs Review Labs Reviewed  CBC WITH DIFFERENTIAL - Abnormal; Notable for the following:    WBC 15.7 (*)    Neutro Abs 11.4 (*)    All other components within normal limits  URINALYSIS, ROUTINE W REFLEX MICROSCOPIC - Abnormal; Notable for the following:    Specific Gravity, Urine 1.034 (*)    All other components within normal limits  COMPREHENSIVE METABOLIC PANEL    Imaging Review Ct Abdomen Pelvis W Contrast  08/14/2013   CLINICAL DATA:  Severe right groin pain.  Possible hernia.  EXAM: CT ABDOMEN AND PELVIS WITH CONTRAST  TECHNIQUE: Multidetector CT imaging of the abdomen and pelvis was performed using the standard protocol following bolus administration of intravenous contrast.  CONTRAST:  100mL OMNIPAQUE IOHEXOL 300 MG/ML  SOLN  COMPARISON:  10/04/2012  FINDINGS: 4 mm nodule in the left lower lobe is unchanged (image 4). Minimal dependent atelectasis is present in the right lower lobe. There is no pleural effusion.  Punctate calcification is noted in the spleen and may reflect prior granulomatous disease. The liver, gallbladder, adrenal glands, right kidney, and pancreas are unremarkable enhanced appearance. 4 mm hypodense lesion in the anterior interpolar left kidney is unchanged and too small to characterize but most likely a cyst.  The appendix is identified in the right lower quadrant and is unremarkable. Oral contrast is present in multiple loops of nondilated small bowel. Colon is not dilated. No gross bowel wall thickening is identified. No hernia or other abnormality is identified in the right groin. Bladder is grossly unremarkable. No free fluid or enlarged lymph nodes are identified.  Multiple old right-sided lower rib fractures are noted.  IMPRESSION: No acute abnormality identified in the abdomen or pelvis. No etiology  of right groin pain identified.   Electronically Signed   By: Sebastian AcheAllen  Grady   On: 08/14/2013 21:19     EKG Interpretation None      7:35 PM Patient seen and examined. Work-up initiated. Medications ordered. Discussed with Dr. Criss AlvineGoldston. Will order CT to r/o infection/abscess, evaluate hernia. Patient has more pain than I would expect for varicocele demonstrated on previous US.   Vital signs reviewed and are as follows: Filed Vitals:   08/14/13 1740  BP: 143/82  Pulse: 90  Temp: 98.3 F (36.8 C)  Resp: 18   CT returns without etiology for R groin pain demonstrated. Pt informed. He is still anxious but reassured. He will seek follow-up with urology and general surgery to delineate cause of pain. Will change from Vicodin to Percocet+ibuprofen. Patient told to wear briefs, use ice as needed.  The patient was urged to return to the Emergency Department immediately with worsening of current symptoms, worsening abdominal pain, persistent vomiting, blood noted in stools, fever, or any other concerns. The patient verbalized understanding.   Patient counseled on use of narcotic pain medications. Counseled not to combine these medications with others containing tylenol. Urged not to drink alcohol, drive, or perform any other activities that requires focus  while taking these medications. The patient verbalizes understanding and agrees with the plan.  MDM   Final diagnoses:  Groin pain   Patient with R groin pain. US done 2 days ago shows varicocele/hydrocele, no torsion. Persistent pain for the past 2 weeks is not consistent with testicular torsion. CT tonight neg for hernia or other problem. No cellulitis on exam. Patient is very tender. Question pain from varicocele? No surgical emergencies or indications for hospitalization identified. Patient to followup with urology/general surgery for further evaluation. Return instructions discussed with patient and family member at bedside. He appears  reliable to return if worsening.  No dangerous or life-threatening conditions suspected or identified by history, physical exam, and by work-up. No indications for hospitalization identified.      Renne CriglerJoshua Brownie Nehme, PA-C 08/14/13 2307

## 2013-08-14 NOTE — Discharge Instructions (Signed)
Please read and follow all provided instructions.  Your diagnoses today include:  1. Groin pain     Tests performed today include:  CT scan of your abdomen and pelvis - does not show any dangerous problems or hernias  Urine test - no infection  Blood counts and electrolytes  Vital signs. See below for your results today.   Medications prescribed:   Percocet (oxycodone/acetaminophen) - narcotic pain medication  DO NOT drive or perform any activities that require you to be awake and alert because this medicine can make you drowsy. BE VERY CAREFUL not to take multiple medicines containing Tylenol (also called acetaminophen). Doing so can lead to an overdose which can damage your liver and cause liver failure and possibly death.   Ibuprofen (Motrin, Advil) - anti-inflammatory pain medication  Do not exceed 600mg  ibuprofen every 6 hours, take with food  You have been prescribed an anti-inflammatory medication or NSAID. Take with food. Take smallest effective dose for the shortest duration needed for your pain. Stop taking if you experience stomach pain or vomiting.   Take any prescribed medications only as directed.  Home care instructions:  Follow any educational materials contained in this packet.  BE VERY CAREFUL not to take multiple medicines containing Tylenol (also called acetaminophen). Doing so can lead to an overdose which can damage your liver and cause liver failure and possibly death.   Follow-up instructions: Please follow-up with the urologist listed next week for further evaluation of your symptoms. If you do not have a primary care doctor -- see below for referral information.   Return instructions:   Please return to the Emergency Department if you experience worsening symptoms.   Return with uncontrolled pain, fever, vomiting, redness of skin in painful area, or any other concerns  Please return if you have any other emergent concerns.  Additional  Information:  Your vital signs today were: BP 115/44   Pulse 86   Temp(Src) 98.3 F (36.8 C) (Oral)   Resp 16   Ht 5\' 7"  (1.702 m)   Wt 170 lb (77.111 kg)   BMI 26.62 kg/m2   SpO2 92% If your blood pressure (BP) was elevated above 135/85 this visit, please have this repeated by your doctor within one month. --------------

## 2013-08-14 NOTE — ED Notes (Signed)
[facility] ago and he was diagnosed with a hernis that gets larger and he massages it and it gets smaller

## 2013-08-17 NOTE — ED Provider Notes (Signed)
Medical screening examination/treatment/procedure(s) were performed by non-physician practitioner and as supervising physician I was immediately available for consultation/collaboration.   EKG Interpretation None        Audree CamelScott T Alyn Riedinger, MD 08/17/13 [phone]

## 2013-11-12 ENCOUNTER — Emergency Department (HOSPITAL_COMMUNITY)
Admission: EM | Admit: 2013-11-12 | Discharge: 2013-11-12 | Disposition: A | Discharge status: Home / Self Care | Payer: Self-pay | Attending: Emergency Medicine | Admitting: Emergency Medicine

## 2013-11-12 ENCOUNTER — Encounter (HOSPITAL_COMMUNITY): Payer: Self-pay | Admitting: Emergency Medicine

## 2013-11-12 DIAGNOSIS — F17200 Nicotine dependence, unspecified, uncomplicated: Secondary | ICD-10-CM | POA: Insufficient documentation

## 2013-11-12 DIAGNOSIS — Z79899 Other long term (current) drug therapy: Secondary | ICD-10-CM | POA: Insufficient documentation

## 2013-11-12 DIAGNOSIS — K089 Disorder of teeth and supporting structures, unspecified: Secondary | ICD-10-CM | POA: Insufficient documentation

## 2013-11-12 DIAGNOSIS — K0889 Other specified disorders of teeth and supporting structures: Secondary | ICD-10-CM

## 2013-11-12 MED ORDER — OXYCODONE-ACETAMINOPHEN 5-325 MG PO TABS: 1.0000 | ORAL_TABLET | Freq: Three times a day (TID) | ORAL | Status: DC | PRN

## 2013-11-12 MED ORDER — OXYCODONE-ACETAMINOPHEN 5-325 MG PO TABS
1.0000 | ORAL_TABLET | Freq: Once | ORAL | Status: AC
  Administered 2013-11-12: 1 via ORAL
  Filled 2013-11-12: qty 1

## 2013-11-12 MED ORDER — CHLORHEXIDINE GLUCONATE 0.12 % MT SOLN: 15.0000 mL | Freq: Two times a day (BID) | OROMUCOSAL | Status: DC

## 2013-11-12 NOTE — ED Provider Notes (Signed)
CSN: [id number]     Arrival date & time 11/12/13  4098 History  This chart was scribed for Gerhard Munch, MD by Elon Spanner, ED Scribe. This patient was seen in room APA07/APA07 and the patient's care was started at 8:10 AM.    Chief Complaint  Patient presents with  . Dental Pain    The history is provided by the patient. No language interpreter was used.    HPI Comments: Richard Valdez is a 33 y.o. male who presents to the Emergency Department complaining of moderate, constant, diffuse dental pain that began to gradually worsen yesterday.  Patient states he recently had one tooth extraction but was unable to have other areas of dental pain addressed for financial reasons.  Patient states that the pain is aggravated by speech.  Patient states he has not taken anything for pain relief.  Patient denies fever, vomiting, confusion, difficulty swallowing, difficulty speaking.   History reviewed. No pertinent past medical history. Past Surgical History  Procedure Laterality Date  . Hernia repair    . I&d extremity Right 10/29/2012    Procedure: Excisional DEBRIDEMENT with Extensor Tendon Repair Right Long and Ring Fingers;  Surgeon: [name], MD;  Location: [facility] OR;  Service: Orthopedics;  Laterality: Right;   Family History  Problem Relation Age of Onset  . Hypertension Mother   . Hypertension Father    History  Substance Use Topics  . Smoking status: Current Every Day Smoker -- 1.50 packs/day for 18 years    Types: Cigarettes  . Smokeless tobacco: Never Used     Comment: last time for marijuana was over 1 year ago.  . Alcohol Use: Yes     Comment: occasional     Review of Systems  Constitutional: Negative for fever.  HENT: Positive for dental problem. Negative for trouble swallowing.   Gastrointestinal: Negative for vomiting.  Neurological: Negative for speech difficulty.  Psychiatric/Behavioral: Negative for confusion.      Allergies  Review of patient's  allergies indicates no known allergies.  Home Medications   Prior to Admission medications   Medication Sig Start Date End Date Taking? Authorizing Provider  acetaminophen (TYLENOL) 500 MG tablet Take 1,000 mg by mouth daily as needed for moderate pain.    Historical Provider, MD  HYDROcodone-acetaminophen (NORCO/VICODIN) 5-325 MG per tablet Take 1 tablet by mouth every 6 (six) hours as needed for moderate pain. 08/12/13   Gerhard Munch, MD  ibuprofen (ADVIL,MOTRIN) 600 MG tablet Take 1 tablet (600 mg total) by mouth every 6 (six) hours as needed. 08/14/13   Renne Crigler, PA-C  oxyCODONE-acetaminophen (PERCOCET/ROXICET) 5-325 MG per tablet Take 1-2 tablets by mouth every 6 (six) hours as needed for severe pain. 08/14/13   Renne Crigler, PA-C   BP 130/79  Pulse 114  Temp(Src) 97.7 F (36.5 C) (Oral)  Resp 18  Ht 5\' 8"  (1.727 m)  Wt 181 lb (82.101 kg)  BMI 27.53 kg/m2  SpO2 100% Physical Exam  Nursing note and vitals reviewed. Constitutional: He is oriented to person, place, and time. He appears well-developed. No distress.  HENT:  Head: Normocephalic and atraumatic.  Mouth/Throat:    Left inferior molars and gumlines without drainage or erythema.    Eyes: Conjunctivae and EOM are normal.  Neck: No tracheal deviation present.  No soft-tissue swelling.  Cardiovascular: Normal rate, regular rhythm and normal heart sounds.   Pulmonary/Chest: Effort normal and breath sounds normal. No respiratory distress. He has no wheezes. He has no rales.  Abdominal: He exhibits no distension.  Musculoskeletal: He exhibits no edema.  Neurological: He is alert and oriented to person, place, and time.  Skin: Skin is warm and dry.  Psychiatric: He has a normal mood and affect.    ED Course  Procedures (including critical care time)  DIAGNOSTIC STUDIES: Oxygen Saturation is 100% on RA, normal by my interpretation.    COORDINATION OF CARE:  8:14 AM Counseled pt on importance of smoking  cessation.  Discussed impression that there was no infection.  Will order oral antibiotic rinse and pain medication.  Patient acknowledges and agrees with plan.     MDM   Final diagnoses:  Pain, dental   Patient presents with dental pain. Patient is mildly tachycardic, but afebrile, with no overt evidence of infection. Airway is patent. Patient has followup scheduled in one week with dentist.   I personally performed the services described in this documentation, which was scribed in my presence. The recorded information has been reviewed and is accurate.    [name], MD 11/12/13 [phone]

## 2013-11-12 NOTE — ED Notes (Signed)
Pt reported 10/10 dental pain upon discharge. Pt was in NAD and ambulated with no difficulty to meet family in waiting room. Pt was talking with staff and thanked staff for care.

## 2013-11-12 NOTE — Discharge Instructions (Signed)

## 2013-11-12 NOTE — ED Notes (Signed)
Pt reports had tooth pulled x2 weeks ago. Pt reports several teeth that need to be pulled and has appointment on July 31. Pt reports increase in pain since yesterday. nad noted. Airway patent.

## 2013-12-12 ENCOUNTER — Encounter (HOSPITAL_COMMUNITY): Payer: Self-pay | Admitting: Emergency Medicine

## 2013-12-12 ENCOUNTER — Emergency Department (HOSPITAL_COMMUNITY)
Admission: EM | Admit: 2013-12-12 | Discharge: 2013-12-12 | Disposition: A | Discharge status: Home / Self Care | Payer: Self-pay | Attending: Emergency Medicine | Admitting: Emergency Medicine

## 2013-12-12 DIAGNOSIS — K089 Disorder of teeth and supporting structures, unspecified: Secondary | ICD-10-CM | POA: Insufficient documentation

## 2013-12-12 DIAGNOSIS — K047 Periapical abscess without sinus: Secondary | ICD-10-CM | POA: Insufficient documentation

## 2013-12-12 DIAGNOSIS — K029 Dental caries, unspecified: Secondary | ICD-10-CM | POA: Insufficient documentation

## 2013-12-12 DIAGNOSIS — Z791 Long term (current) use of non-steroidal anti-inflammatories (NSAID): Secondary | ICD-10-CM | POA: Insufficient documentation

## 2013-12-12 DIAGNOSIS — Z792 Long term (current) use of antibiotics: Secondary | ICD-10-CM | POA: Insufficient documentation

## 2013-12-12 DIAGNOSIS — Z79899 Other long term (current) drug therapy: Secondary | ICD-10-CM | POA: Insufficient documentation

## 2013-12-12 DIAGNOSIS — F17200 Nicotine dependence, unspecified, uncomplicated: Secondary | ICD-10-CM | POA: Insufficient documentation

## 2013-12-12 MED ORDER — HYDROCODONE-ACETAMINOPHEN 5-325 MG PO TABS: 1.0000 | ORAL_TABLET | ORAL | Status: DC | PRN

## 2013-12-12 MED ORDER — AMOXICILLIN 250 MG PO CAPS
500.0000 mg | ORAL_CAPSULE | Freq: Once | ORAL | Status: AC
  Administered 2013-12-12: 500 mg via ORAL
  Filled 2013-12-12: qty 2

## 2013-12-12 MED ORDER — AMOXICILLIN 500 MG PO CAPS: 500.0000 mg | ORAL_CAPSULE | Freq: Three times a day (TID) | ORAL | Status: DC

## 2013-12-12 MED ORDER — OXYCODONE-ACETAMINOPHEN 5-325 MG PO TABS
1.0000 | ORAL_TABLET | Freq: Once | ORAL | Status: AC
  Administered 2013-12-12: 1 via ORAL
  Filled 2013-12-12: qty 1

## 2013-12-12 MED ORDER — NAPROXEN 500 MG PO TABS: 500.0000 mg | ORAL_TABLET | Freq: Two times a day (BID) | ORAL | Status: DC

## 2013-12-12 NOTE — ED Notes (Signed)
MD at bedside. 

## 2013-12-12 NOTE — ED Provider Notes (Signed)
Medical screening examination/treatment/procedure(s) were performed by non-physician practitioner and as supervising physician I was immediately available for consultation/collaboration.   EKG Interpretation None       Rhylan Gross R. Blen Ransome, MD 12/12/13 0849 

## 2013-12-12 NOTE — Discharge Instructions (Signed)
Abscessed Tooth An abscessed tooth is an infection around your tooth. It may be caused by holes or damage to the tooth (cavity) or a dental disease. An abscessed tooth causes mild to very bad pain in and around the tooth. See your dentist right away if you have tooth or gum pain. HOME CARE  Take your medicine as told. Finish it even if you start to feel better.  Do not drive after taking pain medicine.  Rinse your mouth (gargle) often with salt water ( teaspoon salt in 8 ounces of warm water).  Do not apply heat to the outside of your face. GET HELP RIGHT AWAY IF:   You have a temperature by mouth above 102 F (38.9 C), not controlled by medicine.  You have chills and a very bad headache.  You have problems breathing or swallowing.  Your mouth will not open.  You develop puffiness (swelling) on the neck or around the eye.  Your pain is not helped by medicine.  Your pain is getting worse instead of better. MAKE SURE YOU:   Understand these instructions.  Will watch your condition.  Will get help right away if you are not doing well or get worse. Document Released: 09/26/2007 Document Revised: 07/02/2011 Document Reviewed: 07/18/2010 [facility] Patient Information 2015 [place], [place]. This information is not intended to replace advice given to you by your health care provider. Make sure you discuss any questions you have with your health care provider.  Dental Care and Dentist Visits Dental care supports good overall health. Regular dental visits can also help you avoid dental pain, bleeding, infection, and other more serious health problems in the future. It is important to keep the mouth healthy because diseases in the teeth, gums, and other oral tissues can spread to other areas of the body. Some problems, such as diabetes, heart disease, and pre-term labor have been associated with poor oral health.  See your dentist every 6 months. If you experience emergency problems such  as a toothache or broken tooth, go to the dentist right away. If you see your dentist regularly, you may catch problems early. It is easier to be treated for problems in the early stages.  WHAT TO EXPECT AT A DENTIST VISIT  Your dentist will look for many common oral health problems and recommend proper treatment. At your regular dental visit, you can expect:  Gentle cleaning of the teeth and gums. This includes scraping and polishing. This helps to remove the sticky substance around the teeth and gums (plaque). Plaque forms in the mouth shortly after eating. Over time, plaque hardens on the teeth as tartar. If tartar is not removed regularly, it can cause problems. Cleaning also helps remove stains.  Periodic X-rays. These pictures of the teeth and supporting bone will help your dentist assess the health of your teeth.  Periodic fluoride treatments. Fluoride is a natural mineral shown to help strengthen teeth. Fluoride treatmentinvolves applying a fluoride gel or varnish to the teeth. It is most commonly done in children.  Examination of the mouth, tongue, jaws, teeth, and gums to look for any oral health problems, such as:  Cavities (dental caries). This is decay on the tooth caused by plaque, sugar, and acid in the mouth. It is best to catch a cavity when it is small.  Inflammation of the gums caused by plaque buildup (gingivitis).  Problems with the mouth or malformed or misaligned teeth.  Oral cancer or other diseases of the soft tissues or jaws.  KEEP YOUR TEETH AND GUMS HEALTHY °For healthy teeth and gums, follow these general guidelines as well as your dentist's specific advice: °· Have your teeth professionally cleaned at the dentist every 6 months. °· Brush twice daily with a fluoride toothpaste. °· Floss your teeth daily.  °· Ask your dentist if you need fluoride supplements, treatments, or fluoride toothpaste. °· Eat a healthy diet. Reduce foods and drinks with added sugar. °· Avoid  smoking. °TREATMENT FOR ORAL HEALTH PROBLEMS °If you have oral health problems, treatment varies depending on the conditions present in your teeth and gums. °· Your caregiver will most likely recommend good oral hygiene at each visit. °· For cavities, gingivitis, or other oral health disease, your caregiver will perform a procedure to treat the problem. This is typically done at a separate appointment. Sometimes your caregiver will refer you to another dental specialist for specific tooth problems or for surgery. °SEEK IMMEDIATE DENTAL CARE IF: °· You have pain, bleeding, or soreness in the gum, tooth, jaw, or mouth area. °· A permanent tooth becomes loose or separated from the gum socket. °· You experience a blow or injury to the mouth or jaw area. °Document Released: 12/20/2010 Document Revised: 07/02/2011 Document Reviewed: 12/20/2010 °[employer]® Patient Information ©2015 [employer]. This information is not intended to replace advice given to you by your health care provider. Make sure you discuss any questions you have with your health care provider. ° °

## 2013-12-12 NOTE — ED Provider Notes (Signed)
CSN: [id number]     Arrival date & time 12/12/13  0802 History   First MD Initiated Contact with Patient 12/12/13 [id number]     Chief Complaint  Patient presents with  . Dental Pain     (Consider location/radiation/quality/duration/timing/severity/associated sxs/prior Treatment) Patient is a 33 y.o. male presenting with tooth pain. The history is provided by the patient.  Dental Pain Location:  Lower Lower teeth location:  30/RL 1st molar and 20/LL 2nd bicuspid Quality:  Throbbing Severity:  Severe Onset quality:  Gradual Duration:  2 days Timing:  Constant Progression:  Worsening Chronicity:  New Context: abscess   Relieved by:  Nothing Worsened by:  Cold food/drink, touching, pressure and jaw movement Ineffective treatments:  Acetaminophen Associated symptoms: facial pain   Risk factors: lack of dental care and smoking    Park LiterCharlie H Westall is a 33 y.o. male who presents to the ED with dental pain. PMH significant for every day smoker.  History reviewed. No pertinent past medical history. Past Surgical History  Procedure Laterality Date  . Hernia repair    . I&d extremity Right 10/29/2012    Procedure: Excisional DEBRIDEMENT with Extensor Tendon Repair Right Long and Ring Fingers;  Surgeon: Jodi Marbleavid A Thompson, MD;  [facility];  Service: Orthopedics;  Laterality: Right;   Family History  Problem Relation Age of Onset  . Hypertension Mother   . Hypertension Father    History  Substance Use Topics  . Smoking status: Current Every Day Smoker -- 1.50 packs/day for 18 years    Types: Cigarettes  . Smokeless tobacco: Never Used     Comment: last time for marijuana was over 1 year ago.  . Alcohol Use: Yes     Comment: occasional     Review of Systems Negative except as stated in HPI   Allergies  Review of patient's allergies indicates no known allergies.  Home Medications   Prior to Admission medications   Medication Sig Start Date End Date Taking? Authorizing  Provider  amoxicillin (AMOXIL) 500 MG capsule Take 1 capsule (500 mg total) by mouth 3 (three) times daily. 12/12/13   Hope Orlene OchM Neese, NP  chlorhexidine (PERIDEX) 0.12 % solution Use as directed 15 mLs in the mouth or throat 2 (two) times daily. 11/12/13   Gerhard Munchobert Lockwood, MD  HYDROcodone-acetaminophen (NORCO/VICODIN) 5-325 MG per tablet Take 1 tablet by mouth every 4 (four) hours as needed. 12/12/13   Hope Orlene OchM Neese, NP  naproxen (NAPROSYN) 500 MG tablet Take 1 tablet (500 mg total) by mouth 2 (two) times daily. 12/12/13   Hope Orlene OchM Neese, NP   BP 129/79  Pulse 98  Temp(Src) 98.7 F (37.1 C) (Oral)  Resp 18  Ht 5\' 8"  (1.727 m)  Wt 180 lb (81.647 kg)  BMI 27.38 kg/m2  SpO2 100% Physical Exam  Nursing note and vitals reviewed. Constitutional: He is oriented to person, place, and time. He appears well-developed and well-nourished.  HENT:  Nose: Nose normal.  Mouth/Throat: Uvula is midline, oropharynx is clear and moist and mucous membranes are normal. Dental abscesses and dental caries present.    Eyes: EOM are normal.  Neck: Neck supple.  Cardiovascular: Normal rate and regular rhythm.   Pulmonary/Chest: Effort normal and breath sounds normal.  Abdominal: Soft.  Musculoskeletal: Normal range of motion.  Lymphadenopathy:    He has cervical adenopathy (right).  Neurological: He is alert and oriented to person, place, and time. No cranial nerve deficit.  Skin: Skin is warm and dry.  Psychiatric: He has a normal mood and affect. His behavior is normal.    ED Course  Procedures  Antibiotic and pain medication.  MDM  33 y.o. male with dental pain due to caries. Will treat with antibiotics and pain medication. He will follow up with the dentist as scheduled August 28th for extraction of the tooth. Discussed with the patient side effects of narcotic pain medications. Discussed with the patient clinical findings and plan of care. All questioned fully answered.    Medication List    STOP taking  these medications       acetaminophen 500 MG tablet  Commonly known as:  TYLENOL     ibuprofen 600 MG tablet  Commonly known as:  ADVIL,MOTRIN     oxyCODONE-acetaminophen 5-325 MG per tablet  Commonly known as:  PERCOCET/ROXICET      TAKE these medications       amoxicillin 500 MG capsule  Commonly known as:  AMOXIL  Take 1 capsule (500 mg total) by mouth 3 (three) times daily.     HYDROcodone-acetaminophen 5-325 MG per tablet  Commonly known as:  NORCO/VICODIN  Take 1 tablet by mouth every 4 (four) hours as needed.     naproxen 500 MG tablet  Commonly known as:  NAPROSYN  Take 1 tablet (500 mg total) by mouth 2 (two) times daily.      ASK your doctor about these medications       chlorhexidine 0.12 % solution  Commonly known as:  PERIDEX  Use as directed 15 mLs in the mouth or throat 2 (two) times daily.          [facility] Orlene Och, NP 12/12/13 0830

## 2013-12-12 NOTE — ED Notes (Signed)
Pt c/o right lower toothache that started two days ago,

## 2013-12-12 NOTE — ED Notes (Signed)
Pain to bottom right tooth.  Rates pain 10.  Have not taken any medication today.

## 2014-01-15 ENCOUNTER — Emergency Department (HOSPITAL_COMMUNITY)
Admission: EM | Admit: 2014-01-15 | Discharge: 2014-01-15 | Disposition: A | Discharge status: Home / Self Care | Payer: Self-pay | Attending: Emergency Medicine | Admitting: Emergency Medicine

## 2014-01-15 ENCOUNTER — Encounter (HOSPITAL_COMMUNITY): Payer: Self-pay | Admitting: Emergency Medicine

## 2014-01-15 DIAGNOSIS — F17200 Nicotine dependence, unspecified, uncomplicated: Secondary | ICD-10-CM | POA: Insufficient documentation

## 2014-01-15 DIAGNOSIS — Z792 Long term (current) use of antibiotics: Secondary | ICD-10-CM | POA: Insufficient documentation

## 2014-01-15 DIAGNOSIS — K029 Dental caries, unspecified: Secondary | ICD-10-CM | POA: Insufficient documentation

## 2014-01-15 DIAGNOSIS — Z791 Long term (current) use of non-steroidal anti-inflammatories (NSAID): Secondary | ICD-10-CM | POA: Insufficient documentation

## 2014-01-15 DIAGNOSIS — K047 Periapical abscess without sinus: Secondary | ICD-10-CM | POA: Insufficient documentation

## 2014-01-15 DIAGNOSIS — Z79899 Other long term (current) drug therapy: Secondary | ICD-10-CM | POA: Insufficient documentation

## 2014-01-15 DIAGNOSIS — K089 Disorder of teeth and supporting structures, unspecified: Secondary | ICD-10-CM | POA: Insufficient documentation

## 2014-01-15 MED ORDER — AMOXICILLIN 500 MG PO CAPS: 500.0000 mg | ORAL_CAPSULE | Freq: Three times a day (TID) | ORAL | Status: DC

## 2014-01-15 MED ORDER — TRAMADOL HCL 50 MG PO TABS: 50.0000 mg | ORAL_TABLET | Freq: Four times a day (QID) | ORAL | Status: DC | PRN

## 2014-01-15 NOTE — ED Provider Notes (Signed)
Medical screening examination/treatment/procedure(s) were performed by non-physician practitioner and as supervising physician I was immediately available for consultation/collaboration.   EKG Interpretation None       Donnetta Hutching, MD 01/15/14 2019

## 2014-01-15 NOTE — Discharge Instructions (Signed)

## 2014-01-15 NOTE — ED Provider Notes (Signed)
CSN: [id number]     Arrival date & time 01/15/14  1529 History   First MD Initiated Contact with Patient 01/15/14 1537     Chief Complaint  Patient presents with  . Dental Pain     (Consider location/radiation/quality/duration/timing/severity/associated sxs/prior Treatment) Patient is a 33 y.o. male presenting with tooth pain. The history is provided by the patient.  Dental Pain Location:  Lower Lower teeth location:  31/RL 2nd molar and 26/RL lateral incisor Quality:  Throbbing Severity:  Severe Onset quality:  Gradual Duration:  1 day Timing:  Constant Progression:  Worsening Context: abscess and poor dentition   Associated symptoms: gum swelling    Richard Valdez is a 33 y.o. male who presents to the ED with dental pain. He was eating a sandwich today and started having severe pain in the lower right side. He states he had a tooth pulled about a month ago and has an appointment next week for another appointment but this pain today was to severe to wait. He is planning to have all his teeth removed.   History reviewed. No pertinent past medical history. Past Surgical History  Procedure Laterality Date  . Hernia repair    . I&d extremity Right 10/29/2012    Procedure: Excisional DEBRIDEMENT with Extensor Tendon Repair Right Long and Ring Fingers;  Surgeon: [name], MD;  Location: [facility] OR;  Service: Orthopedics;  Laterality: Right;   Family History  Problem Relation Age of Onset  . Hypertension Mother   . Hypertension Father    History  Substance Use Topics  . Smoking status: Current Every Day Smoker -- 1.50 packs/day for 18 years    Types: Cigarettes  . Smokeless tobacco: Never Used     Comment: last time for marijuana was over 1 year ago.  . Alcohol Use: Yes     Comment: occasional     Review of Systems Negative except as stated in HPI   Allergies  Review of patient's allergies indicates no known allergies.  Home Medications   Prior to Admission  medications   Medication Sig Start Date End Date Taking? Authorizing Provider  amoxicillin (AMOXIL) 500 MG capsule Take 1 capsule (500 mg total) by mouth 3 (three) times daily. 12/12/13   Ailey Wessling Orlene Och, NP  chlorhexidine (PERIDEX) 0.12 % solution Use as directed 15 mLs in the mouth or throat 2 (two) times daily. 11/12/13   Gerhard Munch, MD  HYDROcodone-acetaminophen (NORCO/VICODIN) 5-325 MG per tablet Take 1 tablet by mouth every 4 (four) hours as needed. 12/12/13   Tiffny Gemmer Orlene Och, NP  naproxen (NAPROSYN) 500 MG tablet Take 1 tablet (500 mg total) by mouth 2 (two) times daily. 12/12/13   Sharissa Brierley Orlene Och, NP   BP 137/89  Pulse 93  Temp(Src) 98 F (36.7 C) (Oral)  Resp 18  Ht  (1.727 m)  Wt 178 lb (80.74 kg)  BMI 27.07 kg/m2  SpO2 98% Physical Exam  Nursing note and vitals reviewed. Constitutional: He is oriented to person, place, and time. He appears well-developed and well-nourished.  HENT:  Head: Normocephalic.  Nose: Nose normal.  Mouth/Throat: Uvula is midline, oropharynx is clear and moist and mucous membranes are normal.    Decayed teeth and swelling and erythema of the gum surrounding the teeth.   Eyes: EOM are normal.  Neck: Neck supple.  Cardiovascular: Normal rate.   Pulmonary/Chest: Effort normal.  Abdominal: Soft. There is no tenderness.  Musculoskeletal: Normal range of motion.  Lymphadenopathy:  He has cervical adenopathy (right).  Neurological: He is alert and oriented to person, place, and time. No cranial nerve deficit.  Skin: Skin is warm and dry.  Psychiatric: He has a normal mood and affect. His behavior is normal.    ED Course  Procedures ( MDM   33 y.o. male with dental pain. Will treat for infection and pain. He will follow up with his dentist as scheduled next week. Discussed with the patient and all questioned fully answered.    Medication List         amoxicillin 500 MG capsule  Commonly known as:  AMOXIL  Take 1 capsule (500 mg total) by  mouth 3 (three) times daily.     traMADol 50 MG tablet  Commonly known as:  ULTRAM  Take 1 tablet (50 mg total) by mouth every 6 (six) hours as needed.           [place], [place] 01/15/14 [phone]

## 2014-01-15 NOTE — ED Notes (Addendum)
Pt c/o dental pain, states tooth pulled a month ago, states ate BBQ sandwich today and pain increased, made an appt with dentist for Thursday

## 2014-01-15 NOTE — ED Notes (Signed)
Patient given discharge instruction, verbalized understand. Patient ambulatory out of the department.  

## 2014-03-03 ENCOUNTER — Encounter (HOSPITAL_COMMUNITY): Payer: Self-pay | Admitting: Emergency Medicine

## 2014-03-03 ENCOUNTER — Emergency Department (HOSPITAL_COMMUNITY)
Admission: EM | Admit: 2014-03-03 | Discharge: 2014-03-03 | Disposition: A | Discharge status: Home / Self Care | Payer: Self-pay | Attending: Emergency Medicine | Admitting: Emergency Medicine

## 2014-03-03 DIAGNOSIS — S39012A Strain of muscle, fascia and tendon of lower back, initial encounter: Secondary | ICD-10-CM | POA: Insufficient documentation

## 2014-03-03 DIAGNOSIS — Y9289 Other specified places as the place of occurrence of the external cause: Secondary | ICD-10-CM | POA: Insufficient documentation

## 2014-03-03 DIAGNOSIS — Z792 Long term (current) use of antibiotics: Secondary | ICD-10-CM | POA: Insufficient documentation

## 2014-03-03 DIAGNOSIS — Y9389 Activity, other specified: Secondary | ICD-10-CM | POA: Insufficient documentation

## 2014-03-03 DIAGNOSIS — Y998 Other external cause status: Secondary | ICD-10-CM | POA: Insufficient documentation

## 2014-03-03 DIAGNOSIS — Z720 Tobacco use: Secondary | ICD-10-CM | POA: Insufficient documentation

## 2014-03-03 DIAGNOSIS — X58XXXA Exposure to other specified factors, initial encounter: Secondary | ICD-10-CM | POA: Insufficient documentation

## 2014-03-03 MED ORDER — KETOROLAC TROMETHAMINE 10 MG PO TABS
10.0000 mg | ORAL_TABLET | Freq: Once | ORAL | Status: AC
  Administered 2014-03-03: 10 mg via ORAL
  Filled 2014-03-03: qty 1

## 2014-03-03 MED ORDER — ACETAMINOPHEN-CODEINE #3 300-30 MG PO TABS
2.0000 | ORAL_TABLET | Freq: Once | ORAL | Status: AC
  Administered 2014-03-03: 2 via ORAL
  Filled 2014-03-03: qty 2

## 2014-03-03 MED ORDER — ACETAMINOPHEN-CODEINE #3 300-30 MG PO TABS: 1.0000 | ORAL_TABLET | Freq: Four times a day (QID) | ORAL | Status: DC | PRN

## 2014-03-03 MED ORDER — ONDANSETRON HCL 4 MG PO TABS
4.0000 mg | ORAL_TABLET | Freq: Once | ORAL | Status: AC
  Administered 2014-03-03: 4 mg via ORAL
  Filled 2014-03-03: qty 1

## 2014-03-03 MED ORDER — DICLOFENAC SODIUM 75 MG PO TBEC: 75.0000 mg | DELAYED_RELEASE_TABLET | Freq: Two times a day (BID) | ORAL | Status: DC

## 2014-03-03 MED ORDER — METHOCARBAMOL 500 MG PO TABS: 500.0000 mg | ORAL_TABLET | Freq: Three times a day (TID) | ORAL | Status: DC

## 2014-03-03 MED ORDER — DIAZEPAM 5 MG PO TABS
10.0000 mg | ORAL_TABLET | Freq: Once | ORAL | Status: AC
  Administered 2014-03-03: 10 mg via ORAL
  Filled 2014-03-03: qty 2

## 2014-03-03 NOTE — ED Notes (Signed)
Pt reports he was replacing the back end in a vehicle, felt sudden sharp pain in his low back.

## 2014-03-03 NOTE — ED Notes (Signed)
PA at bedside.

## 2014-03-03 NOTE — ED Provider Notes (Signed)
CSN: [id number]     Arrival date & time 03/03/14  1459 History   First MD Initiated Contact with Patient 03/03/14 1734     Chief Complaint  Patient presents with  . Back Pain     (Consider location/radiation/quality/duration/timing/severity/associated sxs/prior Treatment) Patient is a 33 y.o. male presenting with back pain. The history is provided by the patient.  Back Pain Location:  Lumbar spine Quality:  Shooting Pain is:  Same all the time Onset quality:  Sudden Duration:  1 day Timing:  Intermittent Progression:  Worsening Chronicity:  New Context: lifting heavy objects   Relieved by:  Nothing Worsened by:  Nothing tried Ineffective treatments:  None tried Associated symptoms: no abdominal pain, no bladder incontinence, no bowel incontinence, no chest pain, no dysuria and no numbness   Risk factors: no vascular disease     History reviewed. No pertinent past medical history. Past Surgical History  Procedure Laterality Date  . Hernia repair    . I&d extremity Right 10/29/2012    Procedure: Excisional DEBRIDEMENT with Extensor Tendon Repair Right Long and Ring Fingers;  Surgeon: Jodi Marbleavid A Thompson, MD;  [facility];  Service: Orthopedics;  Laterality: Right;   Family History  Problem Relation Age of Onset  . Hypertension Mother   . Hypertension Father    History  Substance Use Topics  . Smoking status: Current Every Day Smoker -- 1.50 packs/day for 18 years    Types: Cigarettes  . Smokeless tobacco: Never Used     Comment: last time for marijuana was over 1 year ago.  . Alcohol Use: Yes     Comment: occasional     Review of Systems  Constitutional: Negative for activity change.       All ROS Neg except as noted in HPI  HENT: Positive for dental problem. Negative for nosebleeds.   Eyes: Negative for photophobia and discharge.  Respiratory: Negative for cough, shortness of breath and wheezing.   Cardiovascular: Negative for chest pain and palpitations.   Gastrointestinal: Negative for abdominal pain, blood in stool and bowel incontinence.  Genitourinary: Negative for bladder incontinence, dysuria, frequency and hematuria.  Musculoskeletal: Positive for back pain. Negative for arthralgias and neck pain.  Skin: Negative.   Neurological: Negative for dizziness, seizures, speech difficulty and numbness.  Psychiatric/Behavioral: Negative for hallucinations and confusion.      Allergies  Review of patient's allergies indicates no known allergies.  Home Medications   Prior to Admission medications   Medication Sig Start Date End Date Taking? Authorizing Provider  amoxicillin (AMOXIL) 500 MG capsule Take 1 capsule (500 mg total) by mouth 3 (three) times daily. 01/15/14   Hope Orlene OchM Neese, NP  traMADol (ULTRAM) 50 MG tablet Take 1 tablet (50 mg total) by mouth every 6 (six) hours as needed. 01/15/14   Hope Orlene OchM Neese, NP   BP 130/80 mmHg  Pulse 107  Temp(Src) 98.3 F (36.8 C) (Oral)  Resp 16  Ht 5\' 8"  (1.727 m)  Wt 178 lb (80.74 kg)  BMI 27.07 kg/m2  SpO2 100% Physical Exam  Constitutional: He is oriented to person, place, and time. He appears well-developed and well-nourished.  Non-toxic appearance.  HENT:  Head: Normocephalic.  Right Ear: Tympanic membrane and external ear normal.  Left Ear: Tympanic membrane and external ear normal.  Eyes: EOM and lids are normal. Pupils are equal, round, and reactive to light.  Neck: Normal range of motion. Neck supple. Carotid bruit is not present.  Cardiovascular: Normal rate, regular  rhythm, normal heart sounds, intact distal pulses and normal pulses.   Pulmonary/Chest: Breath sounds normal. No respiratory distress.  Abdominal: Soft. Bowel sounds are normal. There is no tenderness. There is no guarding.  Musculoskeletal:       Lumbar back: He exhibits decreased range of motion, tenderness and spasm. He exhibits no deformity.  Lymphadenopathy:       Head (right side): No submandibular adenopathy  present.       Head (left side): No submandibular adenopathy present.    He has no cervical adenopathy.  Neurological: He is alert and oriented to person, place, and time. He has normal strength. No cranial nerve deficit or sensory deficit.  Gait slow but steady. No foot drop.  Skin: Skin is warm and dry.  Psychiatric: He has a normal mood and affect. His speech is normal.  Nursing note and vitals reviewed.   ED Course  Procedures (including critical care time) Labs Review Labs Reviewed - No data to display  Imaging Review No results found.   EKG Interpretation None      MDM  No gross neuro deficit noted. Palpable spasm of the lumbar paraspinal area. Vital signs non-acute. Gait is slow but steady.  Plan - use of ice today, then heating pad tomorrow. Rest back as much as possible. Rx for diclofenac, robaxin and tylenol codeine given to the patient. Pt to see orthopedic MD if not improving.   Final diagnoses:  Lumbar strain, initial encounter    *I have reviewed nursing notes, vital signs, and all appropriate lab and imaging results for this patient.[address]    Carli Lefevers M Rett Stehlik, PA-C 03/05/14 1131  Benny LennertJoseph L Zammit, MD 03/06/14 [phone]

## 2014-03-03 NOTE — Discharge Instructions (Signed)
Please apply ice to your back today and tomorrow. On Friday you may use heat to your back. Please use Robaxin and diclofenac daily with food. Use Tylenol codeine every 6 hours as needed for pain. This medication as well as the Robaxin may cause drowsiness, please use with caution. Please see the orthopedic specialist listed above for additional evaluation and management if not improving. Lumbosacral Strain Lumbosacral strain is a strain of any of the parts that make up your lumbosacral vertebrae. Your lumbosacral vertebrae are the bones that make up the lower third of your backbone. Your lumbosacral vertebrae are held together by muscles and tough, fibrous tissue (ligaments).  CAUSES  A sudden blow to your back can cause lumbosacral strain. Also, anything that causes an excessive stretch of the muscles in the low back can cause this strain. This is typically seen when people exert themselves strenuously, fall, lift heavy objects, bend, or crouch repeatedly. RISK FACTORS  Physically demanding work.  Participation in pushing or pulling sports or sports that require a sudden twist of the back (tennis, golf, baseball).  Weight lifting.  Excessive lower back curvature.  Forward-tilted pelvis.  Weak back or abdominal muscles or both.  Tight hamstrings. SIGNS AND SYMPTOMS  Lumbosacral strain may cause pain in the area of your injury or pain that moves (radiates) down your leg.  DIAGNOSIS Your health care provider can often diagnose lumbosacral strain through a physical exam. In some cases, you may need tests such as X-ray exams.  TREATMENT  Treatment for your lower back injury depends on many factors that your clinician will have to evaluate. However, most treatment will include the use of anti-inflammatory medicines. HOME CARE INSTRUCTIONS   Avoid hard physical activities (tennis, racquetball, waterskiing) if you are not in proper physical condition for it. This may aggravate or create  problems.  If you have a back problem, avoid sports requiring sudden body movements. Swimming and walking are generally safer activities.  Maintain good posture.  Maintain a healthy weight.  For acute conditions, you may put ice on the injured area.  Put ice in a plastic bag.  Place a towel between your skin and the bag.  Leave the ice on for 20 minutes, 2-3 times a day.  When the low back starts healing, stretching and strengthening exercises may be recommended. SEEK MEDICAL CARE IF:  Your back pain is getting worse.  You experience severe back pain not relieved with medicines. SEEK IMMEDIATE MEDICAL CARE IF:   You have numbness, tingling, weakness, or problems with the use of your arms or legs.  There is a change in bowel or bladder control.  You have increasing pain in any area of the body, including your belly (abdomen).  You notice shortness of breath, dizziness, or feel faint.  You feel sick to your stomach (nauseous), are throwing up (vomiting), or become sweaty.  You notice discoloration of your toes or legs, or your feet get very cold. MAKE SURE YOU:   Understand these instructions.  Will watch your condition.  Will get help right away if you are not doing well or get worse. Document Released: 01/17/2005 Document Revised: 04/14/2013 Document Reviewed: 11/26/2012 [facility] Patient Information 2015 [place]. This information is not intended to replace advice given to you by your health care provider. Make sure you discuss any questions you have with your health care provider.

## 2014-03-12 ENCOUNTER — Emergency Department (HOSPITAL_COMMUNITY)
Admission: EM | Admit: 2014-03-12 | Discharge: 2014-03-12 | Disposition: A | Discharge status: Home / Self Care | Payer: Self-pay | Attending: Emergency Medicine | Admitting: Emergency Medicine

## 2014-03-12 ENCOUNTER — Encounter (HOSPITAL_COMMUNITY): Payer: Self-pay | Admitting: *Deleted

## 2014-03-12 DIAGNOSIS — M545 Low back pain, unspecified: Secondary | ICD-10-CM

## 2014-03-12 DIAGNOSIS — Z79899 Other long term (current) drug therapy: Secondary | ICD-10-CM | POA: Insufficient documentation

## 2014-03-12 DIAGNOSIS — Z791 Long term (current) use of non-steroidal anti-inflammatories (NSAID): Secondary | ICD-10-CM | POA: Insufficient documentation

## 2014-03-12 DIAGNOSIS — Z720 Tobacco use: Secondary | ICD-10-CM | POA: Insufficient documentation

## 2014-03-12 DIAGNOSIS — Z792 Long term (current) use of antibiotics: Secondary | ICD-10-CM | POA: Insufficient documentation

## 2014-03-12 MED ORDER — PREDNISONE 10 MG PO TABS: 20.0000 mg | ORAL_TABLET | Freq: Two times a day (BID) | ORAL | Status: DC

## 2014-03-12 MED ORDER — PREDNISONE 20 MG PO TABS
40.0000 mg | ORAL_TABLET | Freq: Once | ORAL | Status: AC
  Administered 2014-03-12: 40 mg via ORAL
  Filled 2014-03-12: qty 2

## 2014-03-12 MED ORDER — CYCLOBENZAPRINE HCL 10 MG PO TABS: 10.0000 mg | ORAL_TABLET | Freq: Two times a day (BID) | ORAL | Status: DC | PRN

## 2014-03-12 MED ORDER — OXYCODONE-ACETAMINOPHEN 5-325 MG PO TABS
1.0000 | ORAL_TABLET | Freq: Once | ORAL | Status: AC
  Administered 2014-03-12: 1 via ORAL
  Filled 2014-03-12: qty 1

## 2014-03-12 MED ORDER — CYCLOBENZAPRINE HCL 10 MG PO TABS
10.0000 mg | ORAL_TABLET | Freq: Once | ORAL | Status: AC
  Administered 2014-03-12: 10 mg via ORAL
  Filled 2014-03-12: qty 1

## 2014-03-12 NOTE — ED Notes (Signed)
Back pain , seen here 11/11, says pain is no better

## 2014-03-12 NOTE — ED Provider Notes (Signed)
CSN: [id number]     Arrival date & time 03/12/14  1941 History   First MD Initiated Contact with Patient 03/12/14 2005     Chief Complaint  Patient presents with  . Back Pain     (Consider location/radiation/quality/duration/timing/severity/associated sxs/prior Treatment) Patient is a 33 y.o. male presenting with back pain. The history is provided by the patient.  Back Pain Location:  Lumbar spine Quality:  Aching Radiates to:  Does not radiate Pain is:  Same all the time Timing:  Constant Progression:  Worsening Relieved by:  Nothing  Richard Valdez is a 33 y.o. male who presents to the ED with low back pain. He was here 11/14 for same and treated with Robaxin, tylenol #3 and Diclofenac. He reports that the medications give him nightmares but he continued to take them until he finished. He continues to have back pain and thinks he had blood in his urine tonight or either it was just real dark.   History reviewed. No pertinent past medical history. Past Surgical History  Procedure Laterality Date  . Hernia repair    . I&d extremity Right 10/29/2012    Procedure: Excisional DEBRIDEMENT with Extensor Tendon Repair Right Long and Ring Fingers;  Surgeon: Jodi Marbleavid A Thompson, MD;  [facility];  Service: Orthopedics;  Laterality: Right;   Family History  Problem Relation Age of Onset  . Hypertension Mother   . Hypertension Father    History  Substance Use Topics  . Smoking status: Current Every Day Smoker -- 1.50 packs/day for 18 years    Types: Cigarettes  . Smokeless tobacco: Never Used     Comment: last time for marijuana was over 1 year ago.  . Alcohol Use: Yes     Comment: occasional     Review of Systems  Genitourinary: Positive for hematuria (?).  Musculoskeletal: Positive for back pain.  all other systems negative    Allergies  Review of patient's allergies indicates no known allergies.  Home Medications   Prior to Admission medications   Medication Sig  Start Date End Date Taking? Authorizing Provider  acetaminophen-codeine (TYLENOL #3) 300-30 MG per tablet Take 1-2 tablets by mouth every 6 (six) hours as needed. 03/03/14   Kathie DikeHobson M Bryant, PA-C  amoxicillin (AMOXIL) 500 MG capsule Take 1 capsule (500 mg total) by mouth 3 (three) times daily. Patient not taking: Reported on 03/03/2014 01/15/14   [name], NP  diclofenac (VOLTAREN) 75 MG EC tablet Take 1 tablet (75 mg total) by mouth 2 (two) times daily. 03/03/14   Kathie DikeHobson M Bryant, PA-C  methocarbamol (ROBAXIN) 500 MG tablet Take 1 tablet (500 mg total) by mouth 3 (three) times daily. 03/03/14   Kathie DikeHobson M Bryant, PA-C  traMADol (ULTRAM) 50 MG tablet Take 1 tablet (50 mg total) by mouth every 6 (six) hours as needed. Patient not taking: Reported on 03/03/2014 01/15/14   [name], NP   BP 109/77 mmHg  Pulse 97  Temp(Src) 97.9 F (36.6 C) (Oral)  Resp 20  Ht 5\' 8"  (1.727 m)  Wt 183 lb (83.008 kg)  BMI 27.83 kg/m2  SpO2 96% Physical Exam  Constitutional: He is oriented to person, place, and time. He appears well-developed and well-nourished. No distress.  HENT:  Head: Normocephalic and atraumatic.  Eyes: EOM are normal. Pupils are equal, round, and reactive to light.  Neck: Normal range of motion. Neck supple.  Cardiovascular: Normal rate and regular rhythm.   Pulmonary/Chest: Effort normal. No respiratory  distress. He has no wheezes. He has no rales.  Abdominal: Soft. Bowel sounds are normal. There is no tenderness.  Musculoskeletal: Normal range of motion. He exhibits no edema.       Lumbar back: He exhibits tenderness and spasm. He exhibits no deformity and normal pulse. Decreased range of motion: due to pain.  Pain over left sciatic nerve. No pain over the spine.   Neurological: He is alert and oriented to person, place, and time. He has normal strength and normal reflexes. No cranial nerve deficit or sensory deficit. Gait normal.  Skin: Skin is warm and dry.  Psychiatric: He has  a normal mood and affect. His behavior is normal.  Nursing note and vitals reviewed.   ED Course  Procedures Percocet, flexeril, prednisone prior to d/c MDM  33 y.o. male with persistent low back pain and muscle spasm s/p injury when lifting the back of a car. He did not f/u with ortho due to lack of insurance. Will treat for inflammation and muscle spasm. I discussed with the patient that he will need follow up with ortho if symptoms persist. He voices understanding.    Medication List    TAKE these medications        cyclobenzaprine 10 MG tablet  Commonly known as:  FLEXERIL  Take 1 tablet (10 mg total) by mouth 2 (two) times daily as needed for muscle spasms.     predniSONE 10 MG tablet  Commonly known as:  DELTASONE  Take 2 tablets (20 mg total) by mouth 2 (two) times daily with a meal.      ASK your doctor about these medications        acetaminophen-codeine 300-30 MG per tablet  Commonly known as:  TYLENOL #3  Take 1-2 tablets by mouth every 6 (six) hours as needed.     amoxicillin 500 MG capsule  Commonly known as:  AMOXIL  Take 1 capsule (500 mg total) by mouth 3 (three) times daily.     diclofenac 75 MG EC tablet  Commonly known as:  VOLTAREN  Take 1 tablet (75 mg total) by mouth 2 (two) times daily.     methocarbamol 500 MG tablet  Commonly known as:  ROBAXIN  Take 1 tablet (500 mg total) by mouth 3 (three) times daily.     traMADol 50 MG tablet  Commonly known as:  ULTRAM  Take 1 tablet (50 mg total) by mouth every 6 (six) hours as needed.          Richard Valdez, [place]NP 03/12/14 2147  Donnetta HutchingBrian Cook, MD 03/15/14 [phone]

## 2014-03-12 NOTE — ED Notes (Signed)
Patient given discharge instruction, verbalized understand. Patient ambulatory out of the department.  

## 2014-04-05 ENCOUNTER — Encounter (HOSPITAL_COMMUNITY): Payer: Self-pay | Admitting: Emergency Medicine

## 2014-04-05 ENCOUNTER — Emergency Department (HOSPITAL_COMMUNITY)
Admission: EM | Admit: 2014-04-05 | Discharge: 2014-04-05 | Disposition: A | Discharge status: Home / Self Care | Payer: Self-pay | Attending: Emergency Medicine | Admitting: Emergency Medicine

## 2014-04-05 ENCOUNTER — Emergency Department (HOSPITAL_COMMUNITY): Payer: Self-pay

## 2014-04-05 DIAGNOSIS — Y998 Other external cause status: Secondary | ICD-10-CM | POA: Insufficient documentation

## 2014-04-05 DIAGNOSIS — T1490XA Injury, unspecified, initial encounter: Secondary | ICD-10-CM

## 2014-04-05 DIAGNOSIS — W230XXA Caught, crushed, jammed, or pinched between moving objects, initial encounter: Secondary | ICD-10-CM | POA: Insufficient documentation

## 2014-04-05 DIAGNOSIS — S60221A Contusion of right hand, initial encounter: Secondary | ICD-10-CM | POA: Insufficient documentation

## 2014-04-05 DIAGNOSIS — Z7952 Long term (current) use of systemic steroids: Secondary | ICD-10-CM | POA: Insufficient documentation

## 2014-04-05 DIAGNOSIS — Z791 Long term (current) use of non-steroidal anti-inflammatories (NSAID): Secondary | ICD-10-CM | POA: Insufficient documentation

## 2014-04-05 DIAGNOSIS — Y9289 Other specified places as the place of occurrence of the external cause: Secondary | ICD-10-CM | POA: Insufficient documentation

## 2014-04-05 DIAGNOSIS — Z720 Tobacco use: Secondary | ICD-10-CM | POA: Insufficient documentation

## 2014-04-05 DIAGNOSIS — Y9389 Activity, other specified: Secondary | ICD-10-CM | POA: Insufficient documentation

## 2014-04-05 NOTE — ED Provider Notes (Signed)
CSN: [id number]     Arrival date & time 04/05/14  1534 History  This chart was scribed for non-physician practitioner working with Samuel JesterKathleen McManus, DO by Elveria Risingimelie Horne, ED Scribe. This patient was seen in room APFT24/APFT24 and the patient's care was started at 4:09 PM.   Chief Complaint  Patient presents with  . Hand Injury   The history is provided by the patient. No language interpreter was used.   HPI Comments: Richard Valdez is a 33 y.o. male who presents to the Emergency Department with right hand injury incurred while working on a car today; patient is a [employer]. Patient reports injuring his hand on engine that cranked after his partner bumped into it. Patient presents reporting pain, swelling and limited range of movement due to pain severity. Patient reports prior injury and operation on his his right third and fourth digits in 2014.   History reviewed. No pertinent past medical history. Past Surgical History  Procedure Laterality Date  . Hernia repair    . I&d extremity Right 10/29/2012    Procedure: Excisional DEBRIDEMENT with Extensor Tendon Repair Right Long and Ring Fingers;  Surgeon: Jodi Marbleavid A Thompson, MD;  [facility];  Service: Orthopedics;  Laterality: Right;   Family History  Problem Relation Age of Onset  . Hypertension Mother   . Hypertension Father    History  Substance Use Topics  . Smoking status: Current Every Day Smoker -- 2.00 packs/day for 18 years    Types: Cigarettes  . Smokeless tobacco: Never Used     Comment: last time for marijuana was over 1 year ago.  . Alcohol Use: Yes     Comment: occasional     Review of Systems  Constitutional: Negative for fever and chills.  Musculoskeletal: Positive for arthralgias.  Skin: Positive for color change. Negative for wound.  Neurological: Negative for weakness and numbness.      Allergies  Review of patient's allergies indicates no known allergies.  Home Medications   Prior to Admission  medications   Medication Sig Start Date End Date Taking? Authorizing Provider  acetaminophen-codeine (TYLENOL #3) 300-30 MG per tablet Take 1-2 tablets by mouth every 6 (six) hours as needed. Patient not taking: Reported on 04/05/2014 03/03/14   Kathie DikeHobson M Donaldson Richter, PA-C  cyclobenzaprine (FLEXERIL) 10 MG tablet Take 1 tablet (10 mg total) by mouth 2 (two) times daily as needed for muscle spasms. Patient not taking: Reported on 04/05/2014 03/12/14   [name], NP  diclofenac (VOLTAREN) 75 MG EC tablet Take 1 tablet (75 mg total) by mouth 2 (two) times daily. Patient not taking: Reported on 04/05/2014 03/03/14   Kathie DikeHobson M Rigley Niess, PA-C  methocarbamol (ROBAXIN) 500 MG tablet Take 1 tablet (500 mg total) by mouth 3 (three) times daily. Patient not taking: Reported on 04/05/2014 03/03/14   Kathie DikeHobson M Shammond Arave, PA-C  predniSONE (DELTASONE) 10 MG tablet Take 2 tablets (20 mg total) by mouth 2 (two) times daily with a meal. Patient not taking: Reported on 04/05/2014 03/12/14   [name], NP   Triage Vitals: BP 124/72 mmHg  Pulse 79  Temp(Src) 98.2 F (36.8 C) (Oral)  Resp 18  SpO2 100% Physical Exam  Constitutional: He is oriented to person, place, and time. He appears well-developed and well-nourished. No distress.  HENT:  Head: Normocephalic and atraumatic.  Eyes: EOM are normal.  Neck: Neck supple. No tracheal deviation present.  Cardiovascular: Normal rate, regular rhythm, normal heart sounds and intact distal pulses.  Exam reveals no gallop and no friction rub.   No murmur heard. Pulmonary/Chest: Effort normal. No respiratory distress.  Bilateral expiratiory wheeze. Symmetrical rise and fall of chest.   Musculoskeletal: Normal range of motion.  Right hand: full ROM of right elbow. No effusion. Compartments of the right forearm are soft. Tenderness to palpation at the right wrist and dorsum of right hand. No palpable deformity of dorsum of hand. There is flexion deformity of the ring finger of  right hand from previous injury. The cap refill is < 2 seconds. There is no atropy or injury to thenar eminence.    Neurological: He is alert and oriented to person, place, and time.  Skin: Skin is warm and dry.  Psychiatric: He has a normal mood and affect. His behavior is normal.  Nursing note and vitals reviewed.   ED Course  Procedures (including critical care time)  COORDINATION OF CARE: 4:21 PM- Awaiting X-rayDiscussed treatment plan with patient at bedside and patient agreed to plan.   4:53 PM- Patient's X-rays have been reviewed with no fractures visualized. Patient informed of plans to bandage hand with Ace wrap and discharge home on RICE protocol and anti inflammatory treatment.   Labs Review Labs Reviewed - No data to display  Imaging Review No results found.   EKG Interpretation None      MDM  Xray of the right hand is negative for fracture or dislocation. Compartments are soft on exam. No vascular compromise. Pt may aware of findings. ACE wrap applied, ice pack provided. Pt to see orthopedics or return to the ED if any changes or problem.   Final diagnoses:  Contusion of right hand, initial encounter    *I have reviewed nursing notes, vital signs, and all appropriate lab and imaging results for this patient.**  I personally performed the services described in this documentation, which was scribed in my presence. The recorded information has been reviewed and is accurate.    Kathie DikeHobson M Arnie Clingenpeel, PA-C 04/06/14 1633  Samuel JesterKathleen McManus, DO 04/07/14 [phone]

## 2014-04-05 NOTE — ED Notes (Signed)
Pt states he got his hand caught with a pull bar between a motor and a wrench. Edema and limited ROM to R hand.

## 2014-04-05 NOTE — Discharge Instructions (Signed)
Please see MD at [facility] or [facility] to establish a primary MD to assist you. Your xray is negative for fracture or dislocation. Please apply ice today and tomorrow. Use ACE wrap for the next 4 or 5 days. Use tylenol and/or ibuprofen for soreness. See Dr Romeo AppleHarrison for orthopedic evaluation if needed.

## 2014-05-20 ENCOUNTER — Emergency Department (HOSPITAL_COMMUNITY)
Admission: EM | Admit: 2014-05-20 | Discharge: 2014-05-20 | Disposition: A | Discharge status: Home / Self Care | Payer: Self-pay | Attending: Emergency Medicine | Admitting: Emergency Medicine

## 2014-05-20 ENCOUNTER — Encounter (HOSPITAL_COMMUNITY): Payer: Self-pay

## 2014-05-20 ENCOUNTER — Emergency Department (HOSPITAL_COMMUNITY): Payer: Self-pay

## 2014-05-20 DIAGNOSIS — Z7952 Long term (current) use of systemic steroids: Secondary | ICD-10-CM | POA: Insufficient documentation

## 2014-05-20 DIAGNOSIS — Z720 Tobacco use: Secondary | ICD-10-CM | POA: Insufficient documentation

## 2014-05-20 DIAGNOSIS — Z79899 Other long term (current) drug therapy: Secondary | ICD-10-CM | POA: Insufficient documentation

## 2014-05-20 DIAGNOSIS — Z791 Long term (current) use of non-steroidal anti-inflammatories (NSAID): Secondary | ICD-10-CM | POA: Insufficient documentation

## 2014-05-20 DIAGNOSIS — R109 Unspecified abdominal pain: Secondary | ICD-10-CM | POA: Insufficient documentation

## 2014-05-20 LAB — URINALYSIS, ROUTINE W REFLEX MICROSCOPIC
Bilirubin Urine: NEGATIVE
Glucose, UA: NEGATIVE mg/dL
Ketones, ur: NEGATIVE mg/dL
Leukocytes, UA: NEGATIVE
Nitrite: NEGATIVE
Protein, ur: NEGATIVE mg/dL
Specific Gravity, Urine: 1.025 (ref 1.005–1.030)
Urobilinogen, UA: 0.2 mg/dL (ref 0.0–1.0)
pH: 5 (ref 5.0–8.0)

## 2014-05-20 LAB — I-STAT CHEM 8, ED
BUN: 12 mg/dL (ref 6–23)
Calcium, Ion: 1.17 mmol/L (ref 1.12–1.23)
Chloride: 103 mmol/L (ref 96–112)
Creatinine, Ser: 0.7 mg/dL (ref 0.50–1.35)
Glucose, Bld: 92 mg/dL (ref 70–99)
HCT: 48 % (ref 39.0–52.0)
Hemoglobin: 16.3 g/dL (ref 13.0–17.0)
Potassium: 4.4 mmol/L (ref 3.5–5.1)
Sodium: 141 mmol/L (ref 135–145)
TCO2: 23 mmol/L (ref 0–100)

## 2014-05-20 LAB — URINE MICROSCOPIC-ADD ON

## 2014-05-20 MED ORDER — TRAMADOL HCL 50 MG PO TABS: 50.0000 mg | ORAL_TABLET | Freq: Four times a day (QID) | ORAL | Status: DC | PRN

## 2014-05-20 MED ORDER — HYDROMORPHONE HCL 1 MG/ML IJ SOLN
1.0000 mg | Freq: Once | INTRAMUSCULAR | Status: AC
  Administered 2014-05-20: 1 mg via INTRAMUSCULAR
  Filled 2014-05-20: qty 1

## 2014-05-20 MED ORDER — KETOROLAC TROMETHAMINE 30 MG/ML IJ SOLN
15.0000 mg | Freq: Once | INTRAMUSCULAR | Status: AC
  Administered 2014-05-20: 15 mg via INTRAMUSCULAR
  Filled 2014-05-20: qty 1

## 2014-05-20 NOTE — ED Notes (Signed)
E-signature down.  Unable to get signature.  Pt and wife verbalized understanding of instructions.  Self and Richard BarrierLeslie Cardwell witnessed verbal understanding of instructions.

## 2014-05-20 NOTE — Care Management Note (Signed)
ED/CM noted patient did not have health insurance and/or PCP listed in the computer.  Patient was given the [place] resource handout with information on the clinics, food pantries, and the handout for new health insurance sign-up. Pt was also given a Rx discount card. Patient expressed appreciation for information received. 

## 2014-05-20 NOTE — Discharge Instructions (Signed)
Flank Pain °Flank pain refers to pain that is located on the side of the body between the upper abdomen and the back. The pain may occur over a short period of time (acute) or may be long-term or reoccurring (chronic). It may be mild or severe. Flank pain can be caused by many things. °CAUSES  °Some of the more common causes of flank pain include: °· Muscle strains.   °· Muscle spasms.   °· A disease of your spine (vertebral disk disease).   °· A lung infection (pneumonia).   °· Fluid around your lungs (pulmonary edema).   °· A kidney infection.   °· Kidney stones.   °· A very painful skin rash caused by the chickenpox virus (shingles).   °· Gallbladder disease.   °HOME CARE INSTRUCTIONS  °Home care will depend on the cause of your pain. In general, °· Rest as directed by your caregiver. °· Drink enough fluids to keep your urine clear or pale yellow. °· Only take over-the-counter or prescription medicines as directed by your caregiver. Some medicines may help relieve the pain. °· Tell your caregiver about any changes in your pain. °· Follow up with your caregiver as directed. °SEEK IMMEDIATE MEDICAL CARE IF:  °· Your pain is not controlled with medicine.   °· You have new or worsening symptoms. °· Your pain increases.   °· You have abdominal pain.   °· You have shortness of breath.   °· You have persistent nausea or vomiting.   °· You have swelling in your abdomen.   °· You feel faint or pass out.   °· You have blood in your urine. °· You have a fever or persistent symptoms for more than 2-3 days. °· You have a fever and your symptoms suddenly get worse. °MAKE SURE YOU:  °· Understand these instructions. °· Will watch your condition. °· Will get help right away if you are not doing well or get worse. °Document Released: 05/31/2005 Document Revised: 01/02/2012 Document Reviewed: 11/22/2011 °ExitCare® Patient Information ©2015 [employer]. This information is not intended to replace advice given to you by your  health care provider. Make sure you discuss any questions you have with your health care provider. ° °

## 2014-05-20 NOTE — ED Notes (Signed)
Pt c/o of right flank pain which began Tuesday and has progressively worsened. No blood in urine, no Hx of kidney stones. NAD. Denies all other symptoms.

## 2014-05-20 NOTE — ED Provider Notes (Signed)
CSN: [id number]     Arrival date & time 05/20/14  1112 History  This chart was scribed for Richard RazorStephen Keefe Zawistowski, MD by Luisa DagoPriscilla Tutu, ED Scribe. This patient was seen in room APA18/APA18 and the patient's care was started at 1:18 PM.    Chief Complaint  Patient presents with  . Flank Pain   Patient is a 34 y.o. male presenting with flank pain. The history is provided by the patient and medical records. No language interpreter was used.  Flank Pain This is a new problem. The current episode started 2 days ago. The problem occurs constantly. The problem has been gradually worsening. Pertinent negatives include no chest pain, no abdominal pain, no headaches and no shortness of breath. Nothing aggravates the symptoms. Nothing relieves the symptoms. He has tried nothing for the symptoms.   HPI Comments: Park LiterCharlie H Valdez is a 34 y.o. male who presents to the Emergency Department complaining of sudden onset gradually worsening right flank pain which started 2 days ago. He describes the pain as "sharp" in nature. No prior similar pain. No hx of kidney Stone. Pt denies fever, neck pain, sore throat, visual disturbance, CP, cough, SOB, abdominal pain, nausea, emesis, diarrhea, urinary symptoms, back pain, HA, weakness, numbness and rash as associated symptoms.      History reviewed. No pertinent past medical history. Past Surgical History  Procedure Laterality Date  . Hernia repair    . I&d extremity Right 10/29/2012    Procedure: Excisional DEBRIDEMENT with Extensor Tendon Repair Right Long and Ring Fingers;  Surgeon: Jodi Marbleavid A Thompson, MD;  [facility];  Service: Orthopedics;  Laterality: Right;   Family History  Problem Relation Age of Onset  . Hypertension Mother   . Hypertension Father    History  Substance Use Topics  . Smoking status: Current Every Day Smoker -- 2.00 packs/day for 18 years    Types: Cigarettes  . Smokeless tobacco: Never Used     Comment: last time for marijuana was over 1  year ago.  . Alcohol Use: Yes     Comment: occasional     Review of Systems  Constitutional: Negative for appetite change and fatigue.  HENT: Negative for congestion, ear discharge and sinus pressure.   Eyes: Negative for discharge.  Respiratory: Negative for cough and shortness of breath.   Cardiovascular: Negative for chest pain.  Gastrointestinal: Negative for abdominal pain and diarrhea.  Genitourinary: Positive for flank pain. Negative for frequency and hematuria.  Musculoskeletal: Negative for back pain.  Skin: Negative for rash.  Neurological: Negative for seizures and headaches.  Psychiatric/Behavioral: Negative for hallucinations.    Allergies  Review of patient's allergies indicates no known allergies.  Home Medications   Prior to Admission medications   Medication Sig Start Date End Date Taking? Authorizing Provider  acetaminophen (TYLENOL) 500 MG tablet Take 1,000 mg by mouth every 6 (six) hours as needed for moderate pain.   Yes Historical Provider, MD  acetaminophen-codeine (TYLENOL #3) 300-30 MG per tablet Take 1-2 tablets by mouth every 6 (six) hours as needed. Patient not taking: Reported on 04/05/2014 03/03/14   Kathie DikeHobson M Bryant, PA-C  cyclobenzaprine (FLEXERIL) 10 MG tablet Take 1 tablet (10 mg total) by mouth 2 (two) times daily as needed for muscle spasms. Patient not taking: Reported on 04/05/2014 03/12/14   [name], NP  diclofenac (VOLTAREN) 75 MG EC tablet Take 1 tablet (75 mg total) by mouth 2 (two) times daily. Patient not taking: Reported on 04/05/2014 03/03/14  [name], PA-C  methocarbamol (ROBAXIN) 500 MG tablet Take 1 tablet (500 mg total) by mouth 3 (three) times daily. Patient not taking: Reported on 04/05/2014 03/03/14   [name], PA-C  predniSONE (DELTASONE) 10 MG tablet Take 2 tablets (20 mg total) by mouth 2 (two) times daily with a meal. Patient not taking: Reported on 04/05/2014 03/12/14   Janne Napoleon, NP   BP 115/79  mmHg  Pulse 88  Temp(Src) 98.3 F (36.8 C) (Oral)  Resp 16  Ht  (1.727 m)  Wt 175 lb (79.379 kg)  BMI 26.61 kg/m2  SpO2 98%  Physical Exam  Constitutional: He appears well-developed and well-nourished. No distress.  HENT:  Head: Normocephalic and atraumatic.  Eyes: Conjunctivae are normal. Right eye exhibits no discharge. Left eye exhibits no discharge.  Neck: Neck supple.  Cardiovascular: Normal rate, regular rhythm and normal heart sounds.  Exam reveals no gallop and no friction rub.   No murmur heard. Pulmonary/Chest: Effort normal and breath sounds normal. No respiratory distress.  Abdominal: Soft. He exhibits no distension. There is no tenderness. There is CVA tenderness (left ).  Musculoskeletal: He exhibits no edema or tenderness.  Neurological: He is alert.  Skin: Skin is warm and dry.  Psychiatric: He has a normal mood and affect. His behavior is normal. Thought content normal.  Nursing note and vitals reviewed.   ED Course  Procedures (including critical care time)  DIAGNOSTIC STUDIES: Oxygen Saturation is 98% on RA, normal by my interpretation.    COORDINATION OF CARE: 1:19 PM- Pt advised of plan for treatment and pt agrees.  Medications  HYDROmorphone (DILAUDID) injection 1 mg (not administered)  ketorolac (TORADOL) 30 MG/ML injection 15 mg (not administered)    Labs Review Labs Reviewed  URINALYSIS, ROUTINE W REFLEX MICROSCOPIC - Abnormal; Notable for the following:    Hgb urine dipstick TRACE (*)    All other components within normal limits  URINE MICROSCOPIC-ADD ON    Imaging Review No results found.   EKG Interpretation None      MDM   Final diagnoses:  Left flank pain    33yM with flank pain. Microscopic hematuria noted. Suspected ureteral colic, but no evidence on CT or of other explanatory findings. HD stable. Abdominal exam pretty benign. Doubt emergent process. Symptomatic tx at this point. Return precautions discussed.   I  personally preformed the services scribed in my presence. The recorded information has been reviewed is accurate. Richard Razor, MD.    Richard Razor, MD 05/25/14 [phone]

## 2014-07-09 ENCOUNTER — Encounter (HOSPITAL_COMMUNITY): Payer: Self-pay

## 2014-07-09 ENCOUNTER — Emergency Department (HOSPITAL_COMMUNITY): Payer: Self-pay

## 2014-07-09 ENCOUNTER — Emergency Department (HOSPITAL_COMMUNITY)
Admission: EM | Admit: 2014-07-09 | Discharge: 2014-07-09 | Disposition: A | Discharge status: Home / Self Care | Payer: Self-pay | Attending: Emergency Medicine | Admitting: Emergency Medicine

## 2014-07-09 DIAGNOSIS — S3991XA Unspecified injury of abdomen, initial encounter: Secondary | ICD-10-CM | POA: Insufficient documentation

## 2014-07-09 DIAGNOSIS — S0990XA Unspecified injury of head, initial encounter: Secondary | ICD-10-CM | POA: Insufficient documentation

## 2014-07-09 DIAGNOSIS — Z720 Tobacco use: Secondary | ICD-10-CM | POA: Insufficient documentation

## 2014-07-09 DIAGNOSIS — Y998 Other external cause status: Secondary | ICD-10-CM | POA: Insufficient documentation

## 2014-07-09 DIAGNOSIS — Y9289 Other specified places as the place of occurrence of the external cause: Secondary | ICD-10-CM | POA: Insufficient documentation

## 2014-07-09 DIAGNOSIS — Y9389 Activity, other specified: Secondary | ICD-10-CM | POA: Insufficient documentation

## 2014-07-09 LAB — ETHANOL: ALCOHOL ETHYL (B): 5 mg/dL (ref 0–9)

## 2014-07-09 MED ORDER — IBUPROFEN 600 MG PO TABS: 600.0000 mg | ORAL_TABLET | Freq: Four times a day (QID) | ORAL | Status: DC | PRN
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
## 2014-07-09 NOTE — Discharge Instructions (Signed)
Assault, General  Assault includes any behavior, whether intentional or reckless, which results in bodily injury to another person and/or damage to property. Included in this would be any behavior, intentional or reckless, that by its nature would be understood (interpreted) by a reasonable person as intent to harm another person or to damage his/her property. Threats may be oral or written. They may be communicated through regular mail, computer, fax, or phone. These threats may be direct or implied.  FORMS OF ASSAULT INCLUDE:  · Physically assaulting a person. This includes physical threats to inflict physical harm as well as:  ¨ Slapping.  ¨ Hitting.  ¨ Poking.  ¨ Kicking.  ¨ Punching.  ¨ Pushing.  · Arson.  · Sabotage.  · Equipment vandalism.  · Damaging or destroying property.  · Throwing or hitting objects.  · Displaying a weapon or an object that appears to be a weapon in a threatening manner.  ¨ Carrying a firearm of any kind.  ¨ Using a weapon to harm someone.  · Using greater physical size/strength to intimidate another.  ¨ Making intimidating or threatening gestures.  ¨ Bullying.  ¨ Hazing.  · Intimidating, threatening, hostile, or abusive language directed toward another person.  ¨ It communicates the intention to engage in violence against that person. And it leads a reasonable person to expect that violent behavior may occur.  · Stalking another person.  IF IT HAPPENS AGAIN:  · Immediately call for emergency help (911 in [place]  · If someone poses clear and immediate danger to you, seek legal authorities to have a protective or restraining order put in place.  · Less threatening assaults can at least be reported to authorities.  STEPS TO TAKE IF A SEXUAL ASSAULT HAS HAPPENED  · Go to an area of safety. This may include a shelter or staying with a friend. Stay away from the area where you have been attacked. A large percentage of sexual assaults are caused by a friend, relative or associate.  · If  medications were given by your caregiver, take them as directed for the full length of time prescribed.  · Only take over-the-counter or prescription medicines for pain, discomfort, or fever as directed by your caregiver.  · If you have come in contact with a sexual disease, find out if you are to be tested again. If your caregiver is concerned about the HIV/AIDS virus, he/she may require you to have continued testing for several months.  · For the protection of your privacy, test results can not be given over the phone. Make sure you receive the results of your test. If your test results are not back during your visit, make an appointment with your caregiver to find out the results. Do not assume everything is normal if you have not heard from your caregiver or the medical facility. It is important for you to follow up on all of your test results.  · File appropriate papers with authorities. This is important in all assaults, even if it has occurred in a family or by a friend.  SEEK MEDICAL CARE IF:  · You have new problems because of your injuries.  · You have problems that may be because of the medicine you are taking, such as:  ¨ Rash.  ¨ Itching.  ¨ Swelling.  ¨ Trouble breathing.  · You develop belly (abdominal) pain, feel sick to your stomach (nausea) or are vomiting.  · You begin to run a temperature.  · You   need supportive care or referral to a rape crisis center. These are centers with trained personnel who can help you get through this ordeal.  SEEK IMMEDIATE MEDICAL CARE IF:  · You are afraid of being threatened, beaten, or abused. In [place] call 911.  · You receive new injuries related to abuse.  · You develop severe pain in any area injured in the assault or have any change in your condition that concerns you.  · You faint or lose consciousness.  · You develop chest pain or shortness of breath.  Document Released: 04/09/2005 Document Revised: 07/02/2011 Document Reviewed: 11/26/2007  [employer]® Patient  Information ©2015 [employer]. This information is not intended to replace advice given to you by your health care provider. Make sure you discuss any questions you have with your health care provider.
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
## 2014-07-09 NOTE — ED Notes (Signed)
Pt uncooperative. Staggering around. States he has been here long enough and he is ready to go. Pt instructed to stay on his bed before he fell
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
## 2014-07-09 NOTE — ED Notes (Signed)
Pt left before receiving discharge paper work. States he was leaving because we had not done a damn thing for him
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
## 2015-06-12 ENCOUNTER — Emergency Department (HOSPITAL_COMMUNITY): Payer: Self-pay

## 2015-06-12 ENCOUNTER — Encounter (HOSPITAL_COMMUNITY): Payer: Self-pay | Admitting: Emergency Medicine

## 2015-06-12 ENCOUNTER — Emergency Department (HOSPITAL_COMMUNITY)
Admission: EM | Admit: 2015-06-12 | Discharge: 2015-06-12 | Disposition: A | Discharge status: Home / Self Care | Payer: Self-pay | Attending: Emergency Medicine | Admitting: Emergency Medicine

## 2015-06-12 DIAGNOSIS — R079 Chest pain, unspecified: Secondary | ICD-10-CM | POA: Insufficient documentation

## 2015-06-12 DIAGNOSIS — Z5321 Procedure and treatment not carried out due to patient leaving prior to being seen by health care provider: Secondary | ICD-10-CM | POA: Insufficient documentation

## 2015-06-12 DIAGNOSIS — F17210 Nicotine dependence, cigarettes, uncomplicated: Secondary | ICD-10-CM | POA: Insufficient documentation

## 2015-06-12 NOTE — ED Notes (Signed)
Called to go to ER room, no answer. 

## 2015-06-12 NOTE — ED Notes (Signed)
No answer x3

## 2015-06-12 NOTE — ED Notes (Addendum)
Patient c/o intermittent chest pain, mid-sternal x2 weeks. Per patient pain radiated into left arm today, causing numbness in left hand. Patient does reports some shortness of breath. Denies any cardiac hx.

## 2015-06-20 ENCOUNTER — Emergency Department (HOSPITAL_COMMUNITY)
Admission: EM | Admit: 2015-06-20 | Discharge: 2015-06-20 | Disposition: A | Discharge status: Home / Self Care | Payer: Self-pay | Attending: Emergency Medicine | Admitting: Emergency Medicine

## 2015-06-20 ENCOUNTER — Encounter (HOSPITAL_COMMUNITY): Payer: Self-pay | Admitting: *Deleted

## 2015-06-20 ENCOUNTER — Emergency Department (HOSPITAL_COMMUNITY): Payer: Self-pay

## 2015-06-20 DIAGNOSIS — F17210 Nicotine dependence, cigarettes, uncomplicated: Secondary | ICD-10-CM | POA: Insufficient documentation

## 2015-06-20 DIAGNOSIS — Z5321 Procedure and treatment not carried out due to patient leaving prior to being seen by health care provider: Secondary | ICD-10-CM | POA: Insufficient documentation

## 2015-06-20 LAB — BASIC METABOLIC PANEL
Anion gap: 8 (ref 5–15)
BUN: 14 mg/dL (ref 6–20)
CO2: 25 mmol/L (ref 22–32)
Calcium: 9.1 mg/dL (ref 8.9–10.3)
Chloride: 106 mmol/L (ref 101–111)
Creatinine, Ser: 0.76 mg/dL (ref 0.61–1.24)
GFR calc Af Amer: >60 mL/min (ref >60)
GFR calc non Af Amer: >60 mL/min (ref >60)
Glucose, Bld: 123 mg/dL — ABNORMAL HIGH (ref 65–99)
Potassium: 3.7 mmol/L (ref 3.5–5.1)
Sodium: 139 mmol/L (ref 135–145)

## 2015-06-20 LAB — CBC
HCT: 48.5 % (ref 39.0–52.0)
Hemoglobin: 15.9 g/dL (ref 13.0–17.0)
MCH: 30.9 pg (ref 26.0–34.0)
MCHC: 32.8 g/dL (ref 30.0–36.0)
MCV: 94.4 fL (ref 78.0–100.0)
PLATELETS: 263 10*3/uL (ref 150–400)
RBC: 5.14 MIL/uL (ref 4.22–5.81)
RDW: 13 % (ref 11.5–15.5)
WBC: 7.7 10*3/uL (ref 4.0–10.5)

## 2015-06-20 LAB — TROPONIN I: Troponin I: <0.03 ng/mL (ref <0.031)

## 2015-06-20 NOTE — ED Notes (Signed)
Pt called again to be moved to room, no answer.

## 2015-06-20 NOTE — ED Notes (Signed)
Pt called to go back to room, no answer.  

## 2015-06-20 NOTE — ED Notes (Signed)
Pt comes in with central chest pain that has been there over one month ago, pt states it gets worse when he lays down. Pt is anxious upon triage. EKG given to MD Rancour. Pt is a very poor historian.
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
## 2018-05-14 ENCOUNTER — Other Ambulatory Visit: Payer: Self-pay

## 2018-05-14 ENCOUNTER — Emergency Department (HOSPITAL_COMMUNITY)
Admission: EM | Admit: 2018-05-14 | Discharge: 2018-05-14 | Disposition: A | Discharge status: Home / Self Care | Payer: Self-pay | Attending: Emergency Medicine | Admitting: Emergency Medicine

## 2018-05-14 ENCOUNTER — Encounter (HOSPITAL_COMMUNITY): Payer: Self-pay | Admitting: *Deleted

## 2018-05-14 DIAGNOSIS — K0889 Other specified disorders of teeth and supporting structures: Secondary | ICD-10-CM

## 2018-05-14 DIAGNOSIS — F17210 Nicotine dependence, cigarettes, uncomplicated: Secondary | ICD-10-CM | POA: Insufficient documentation

## 2018-05-14 DIAGNOSIS — K047 Periapical abscess without sinus: Secondary | ICD-10-CM | POA: Insufficient documentation

## 2018-05-14 MED ORDER — CLINDAMYCIN HCL 300 MG PO CAPS: 300.0000 mg | ORAL_CAPSULE | Freq: Three times a day (TID) | ORAL | 0 refills | Status: AC

## 2018-05-14 MED ORDER — CLINDAMYCIN HCL 300 MG PO CAPS: 300.0000 mg | ORAL_CAPSULE | Freq: Three times a day (TID) | ORAL | 0 refills | Status: DC

## 2018-05-14 MED ORDER — IBUPROFEN 400 MG PO TABS
600.0000 mg | ORAL_TABLET | Freq: Once | ORAL | Status: AC
  Administered 2018-05-14: 600 mg via ORAL
  Filled 2018-05-14: qty 1

## 2018-05-14 NOTE — ED Notes (Signed)
Pt c/o pain in right upper and lower teeth.  Pt requesting antibiotics for same

## 2018-05-14 NOTE — Discharge Instructions (Addendum)
Prescribed antibiotics to help with your dental infection, please take 1 capsule three times a day for the next 7 days.  Please follow-up with your dentist on your schedule appointment of January 31.  If your symptoms worsens or you experience any fever please return to the emergency room.

## 2018-05-14 NOTE — ED Provider Notes (Signed)
[facility] EMERGENCY DEPARTMENT Provider Note   CSN: [id number] Arrival date & time: 05/14/18  1925     History   Chief Complaint Chief Complaint  Patient presents with  . Dental Pain    HPI Richard Valdez is a 38 y.o. male.  38 y.o male with no PMH presents to the ED with a chief complaint of dental pain x 2 days. Patient reports her pain along the lower and upper region.  He also states the pain is worse with mastication.  Patient has been taking ibuprofen for the pain along with applying BC powders to the area and states some improvement in symptoms.  He has not been seen by a dentist in over a month and states he schedule an appointment for the end of this month on January 31.  He denies any fevers, difficulty swallowing, or shortness of breath.     History reviewed. No pertinent past medical history.  There are no active problems to display for this patient.   Past Surgical History:  Procedure Laterality Date  . HERNIA REPAIR    . I&D EXTREMITY Right 10/29/2012   Procedure: Excisional DEBRIDEMENT with Extensor Tendon Repair Right Long and Ring Fingers;  Surgeon: [name], MD;  Location: [facility] OR;  Service: Orthopedics;  Laterality: Right;        Home Medications    Prior to Admission medications   Medication Sig Start Date End Date Taking? Authorizing Provider  acetaminophen (TYLENOL) 500 MG tablet Take 1,000 mg by mouth every 6 (six) hours as needed for moderate pain.    [provider]  acetaminophen-codeine (TYLENOL #3) 300-30 MG per tablet Take 1-2 tablets by mouth every 6 (six) hours as needed. Patient not taking: Reported on 04/05/2014 03/03/14   Ivery Quale, PA-C  clindamycin (CLEOCIN) 300 MG capsule Take 1 capsule (300 mg total) by mouth 3 (three) times daily for 7 days. 05/14/18 05/21/18  Claude Manges, PA-C  cyclobenzaprine (FLEXERIL) 10 MG tablet Take 1 tablet (10 mg total) by mouth 2 (two) times daily as needed for  muscle spasms. Patient not taking: Reported on 04/05/2014 03/12/14   Janne Napoleon, NP  diclofenac (VOLTAREN) 75 MG EC tablet Take 1 tablet (75 mg total) by mouth 2 (two) times daily. Patient not taking: Reported on 04/05/2014 03/03/14   Ivery Quale, PA-C  ibuprofen (ADVIL,MOTRIN) 600 MG tablet Take 1 tablet (600 mg total) by mouth every 6 (six) hours as needed. 07/09/14   Horton, Mayer Masker, MD  methocarbamol (ROBAXIN) 500 MG tablet Take 1 tablet (500 mg total) by mouth 3 (three) times daily. Patient not taking: Reported on 04/05/2014 03/03/14   Ivery Quale, PA-C  predniSONE (DELTASONE) 10 MG tablet Take 2 tablets (20 mg total) by mouth 2 (two) times daily with a meal. Patient not taking: Reported on 04/05/2014 03/12/14   Janne Napoleon, NP  traMADol (ULTRAM) 50 MG tablet Take 1 tablet (50 mg total) by mouth every 6 (six) hours as needed. Patient not taking: Reported on 07/09/2014 05/20/14   Raeford Razor, MD    Family History Family History  Problem Relation Age of Onset  . Hypertension Mother   . Hypertension Father     Social History Social History   Tobacco Use  . Smoking status: Current Every Day Smoker    Packs/day: 2.50    Years: 18.00    Pack years: 45.00    Types: Cigarettes  . Smokeless tobacco: Never Used  Substance Use  Topics  . Alcohol use: Yes    Comment: occasional   . Drug use: No     Allergies   Patient has no known allergies.   Review of Systems Review of Systems  Constitutional: Negative for fever.  HENT: Positive for dental problem.      Physical Exam Updated Vital Signs BP (!) 136/95 (BP Location: Right Arm)   Pulse 88   Temp 98 F (36.7 C) (Oral)   Resp 18   SpO2 98%   Physical Exam Vitals signs and nursing note reviewed.  Constitutional:      Appearance: He is well-developed.  HENT:     Head: Normocephalic and atraumatic.     Mouth/Throat:     Dentition: Dental caries and dental abscesses present. No gingival swelling.      Pharynx: Oropharynx is clear. Uvula midline.      Comments: Multiple broken off tooth on the upper and lower area, small abscess surrounding upper tooth.  Erythema surrounding. Eyes:     General: No scleral icterus.    Pupils: Pupils are equal, round, and reactive to light.  Neck:     Musculoskeletal: Normal range of motion.  Cardiovascular:     Heart sounds: Normal heart sounds.  Pulmonary:     Effort: Pulmonary effort is normal.     Breath sounds: Normal breath sounds. No wheezing.  Chest:     Chest wall: No tenderness.  Abdominal:     General: Bowel sounds are normal. There is no distension.     Palpations: Abdomen is soft.     Tenderness: There is no abdominal tenderness.  Musculoskeletal:        General: No tenderness or deformity.  Skin:    General: Skin is warm and dry.  Neurological:     Mental Status: He is alert and oriented to person, place, and time.      ED Treatments / Results  Labs (all labs ordered are listed, but only abnormal results are displayed) Labs Reviewed - No data to display  EKG None  Radiology No results found.  Procedures Procedures (including critical care time)  Medications Ordered in ED Medications  ibuprofen (ADVIL,MOTRIN) tablet 600 mg (has no administration in time range)     Initial Impression / Assessment and Plan / ED Course  I have reviewed the triage vital signs and the nursing notes.  Pertinent labs & imaging results that were available during my care of the patient were reviewed by me and considered in my medical decision making (see chart for details).    Presents with recurrent tooth pain and abscess.  Has a appointment scheduled with his dentist at the end of the month.  Patient has no pain around the ear or other complaints at this time.  Will prescribe him clindamycin to treat his infection and have him follow-up with dentist.  He denies any fevers, tachycardia or other complaints at this time.  Final Clinical  Impressions(s) / ED Diagnoses   Final diagnoses:  Pain, dental    ED Discharge Orders         Ordered    clindamycin (CLEOCIN) 300 MG capsule  3 times daily     05/14/18 2116           Claude MangesSoto, Khali Perella, PA-C 05/14/18 2123    Charlynne PanderYao, David Hsienta, MD 05/14/18 2211

## 2018-05-14 NOTE — ED Triage Notes (Signed)
Right lower tooth pain for 2 days.

## 2019-10-19 ENCOUNTER — Encounter (HOSPITAL_COMMUNITY): Payer: Self-pay

## 2019-10-19 ENCOUNTER — Ambulatory Visit (HOSPITAL_COMMUNITY)
Admission: EM | Admit: 2019-10-19 | Discharge: 2019-10-19 | Disposition: A | Discharge status: Home / Self Care | Payer: 59 | Attending: Family Medicine | Admitting: Family Medicine

## 2019-10-19 ENCOUNTER — Other Ambulatory Visit: Payer: Self-pay

## 2019-10-19 DIAGNOSIS — M79641 Pain in right hand: Secondary | ICD-10-CM | POA: Diagnosis not present

## 2019-10-19 DIAGNOSIS — M79642 Pain in left hand: Secondary | ICD-10-CM

## 2019-10-19 MED ORDER — PREDNISONE 5 MG PO TABS: ORAL_TABLET | ORAL | 0 refills | Status: DC

## 2019-10-19 NOTE — ED Triage Notes (Signed)
Pt reports bilateral wrist shooting pain, thumb, index finger and middle fingers gets "lock" and stay for 30 seconds.Richard Valdez

## 2019-10-19 NOTE — ED Provider Notes (Signed)
[facility]    CSN: [id number] Arrival date & time: 10/19/19  2841      History   Chief Complaint Chief Complaint  Patient presents with  . Hand Pain    HPI Richard Valdez is a 39 y.o. male.   He is presenting with bilateral hand pain.  The pain is been ongoing for several weeks.  It is worse in the left hand.  He also is unable to flex his bicep on the right.  He denies any specific inciting event.  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  His symptoms do seem to be worse at night and in the morning.  They seem to lock up from time to time.  Has not done anything for previously.  No specific inciting event.  He does have a burning and tingling sensation in several digits.  HPI  History reviewed. No pertinent past medical history.  There are no problems to display for this patient.   Past Surgical History:  Procedure Laterality Date  . HERNIA REPAIR    . I & D EXTREMITY Right 10/29/2012   Procedure: Excisional DEBRIDEMENT with Extensor Tendon Repair Right Long and Ring Fingers;  Surgeon: [name], MD;  Location: [facility] OR;  Service: Orthopedics;  Laterality: Right;       Home Medications    Prior to Admission medications   Medication Sig Start Date End Date Taking? Authorizing Provider  acetaminophen (TYLENOL) 500 MG tablet Take 1,000 mg by mouth every 6 (six) hours as needed for moderate pain.    [provider]  ibuprofen (ADVIL,MOTRIN) 600 MG tablet Take 1 tablet (600 mg total) by mouth every 6 (six) hours as needed. 07/09/14   Horton, Mayer Masker, MD  predniSONE (DELTASONE) 5 MG tablet Take 6 pills for first day, 5 pills second day, 4 pills third day, 3 pills fourth day, 2 pills the fifth day, and 1 pill sixth day. 10/19/19   Myra Rude, MD    Family History Family History  Problem Relation Age of Onset  . Hypertension Mother   . Hypertension Father     Social History Social History   Tobacco Use  . Smoking  status: Current Every Day Smoker    Packs/day: 2.50    Years: 18.00    Pack years: 45.00    Types: Cigarettes  . Smokeless tobacco: Never Used  Substance Use Topics  . Alcohol use: Yes    Comment: occasional   . Drug use: No     Allergies   Patient has no known allergies.   Review of Systems Review of Systems  See HPI  Physical Exam Triage Vital Signs ED Triage Vitals  Enc Vitals Group     BP 10/19/19 1903 130/80     Pulse Rate 10/19/19 1903 98     Resp 10/19/19 1903 18     Temp 10/19/19 1903 98.4 F (36.9 C)     Temp Source 10/19/19 1903 Oral     SpO2 10/19/19 1903 100 %     Weight --      Height --      Head Circumference --      Peak Flow --      Pain Score 10/19/19 1917 8     Pain Loc --      Pain Edu? --      Excl. in GC? --    No data found.  Updated Vital Signs BP  130/80 (BP Location: Left Arm)   Pulse 98   Temp 98.4 F (36.9 C) (Oral)   Resp 18   SpO2 100%   Visual Acuity Right Eye Distance:   Left Eye Distance:   Bilateral Distance:    Right Eye Near:   Left Eye Near:    Bilateral Near:     Physical Exam Gen: NAD, alert, cooperative with exam, well-appearing ENT: normal lips, normal nasal mucosa,  Eye: normal EOM, normal conjunctiva and lids Skin: no rashes, no areas of induration  Neuro: normal tone, normal sensation to touch Psych:  normal insight, alert and oriented MSK:  Right and left hand:  No signs of atrophy, No triggering. Some warmth appreciated over the carpal bones on the left. Unable to make a complete fist on the left. Distal biceps tendon is intact but and able to fire the biceps tendon. Neurovascularly intact   UC Treatments / Results  Labs (all labs ordered are listed, but only abnormal results are displayed) Labs Reviewed - No data to display  EKG   Radiology No results found.  Procedures Procedures (including critical care time)  Medications Ordered in UC Medications - No data to display  Initial  Impression / Assessment and Plan / UC Course  I have reviewed the triage vital signs and the nursing notes.  Pertinent labs & imaging results that were available during my care of the patient were reviewed by me and considered in my medical decision making (see chart for details).     Richard Valdez is a 39 year old male that is presenting with bilateral hand pain.  Symptoms seem to be more carpal tunnel related.  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  Will provide with prednisone and left wrist brace.  Counseled on follow-up.  Final Clinical Impressions(s) / UC Diagnoses   Final diagnoses:  Bilateral hand pain     Discharge Instructions     Please try the wrist brace Please follow up for your symptoms.     ED Prescriptions    Medication Sig Dispense Auth. Provider   predniSONE (DELTASONE) 5 MG tablet Take 6 pills for first day, 5 pills second day, 4 pills third day, 3 pills fourth day, 2 pills the fifth day, and 1 pill sixth day. 21 tablet Rosemarie Ax, MD     PDMP not reviewed this encounter.   Rosemarie Ax, MD 10/19/19 2001

## 2019-10-19 NOTE — Discharge Instructions (Signed)
Please try the wrist brace Please follow up for your symptoms.

## 2019-10-22 ENCOUNTER — Ambulatory Visit: Payer: 59 | Admitting: Family Medicine

## 2019-10-23 ENCOUNTER — Ambulatory Visit: Payer: Self-pay

## 2019-10-23 ENCOUNTER — Encounter: Payer: Self-pay | Admitting: Family Medicine

## 2019-10-23 ENCOUNTER — Other Ambulatory Visit: Payer: Self-pay

## 2019-10-23 ENCOUNTER — Ambulatory Visit: Payer: 59 | Admitting: Family Medicine

## 2019-10-23 VITALS — BP 110/69 | HR 80 | Ht 69.0 in | Wt 200.0 lb

## 2019-10-23 DIAGNOSIS — S46211A Strain of muscle, fascia and tendon of other parts of biceps, right arm, initial encounter: Secondary | ICD-10-CM | POA: Diagnosis not present

## 2019-10-23 DIAGNOSIS — G5620 Lesion of ulnar nerve, unspecified upper limb: Secondary | ICD-10-CM | POA: Insufficient documentation

## 2019-10-23 DIAGNOSIS — R202 Paresthesia of skin: Secondary | ICD-10-CM | POA: Diagnosis not present

## 2019-10-23 DIAGNOSIS — M79642 Pain in left hand: Secondary | ICD-10-CM

## 2019-10-23 MED ORDER — METHYLPREDNISOLONE ACETATE 40 MG/ML IJ SUSP
40.0000 mg | Freq: Once | INTRAMUSCULAR | Status: AC
  Administered 2019-10-23: 40 mg via INTRA_ARTICULAR

## 2019-10-23 NOTE — Assessment & Plan Note (Signed)
Occurring bilaterally.  Seems carpal tunnel in nature but does occur on the dorsum of the hand.  Worse at night.  No signs of atrophy.  Median nerve is noted to be normal on ultrasound. -Counseled on home exercise therapy and supportive care. -Left carpal tunnel injection today. -CBC, CMP, B12 and folate, A1c and TSH. -May need to consider EMG.

## 2019-10-23 NOTE — Progress Notes (Signed)
Richard Valdez - 39 y.o. male MRN [id number]  Date of birth: [birth year]  SUBJECTIVE:  Including CC & ROS.  Chief Complaint  Patient presents with  . Hand Pain    bilateral / left worse    Richard Valdez is a 39 y.o. male that is presenting with bilateral hand pain and paresthesia.  He also has lack of the right bicep function.  He symptoms been acute on chronic in nature.  They seem to be worse at night.  No improvement with prednisone thus far.   Review of Systems See HPI   HISTORY: Past Medical, Surgical, Social, and Family History Reviewed & Updated per EMR.   Pertinent Historical Findings include:  No past medical history on file.  Past Surgical History:  Procedure Laterality Date  . HERNIA REPAIR    . I & D EXTREMITY Right 10/29/2012   Procedure: Excisional DEBRIDEMENT with Extensor Tendon Repair Right Long and Ring Fingers;  Surgeon: [name], MD;  Location: [facility] OR;  Service: Orthopedics;  Laterality: Right;    Family History  Problem Relation Age of Onset  . Hypertension Mother   . Hypertension Father     Social History   Socioeconomic History  . Marital status: Single    Spouse name: Not on file  . Number of children: Not on file  . Years of education: Not on file  . Highest education level: Not on file  Occupational History  . Not on file  Tobacco Use  . Smoking status: Current Every Day Smoker    Packs/day: 2.50    Years: 18.00    Pack years: 45.00    Types: Cigarettes  . Smokeless tobacco: Never Used  Substance and Sexual Activity  . Alcohol use: Yes    Comment: occasional   . Drug use: No  . Sexual activity: Yes  Other Topics Concern  . Not on file  Social History Narrative  . Not on file   Social Determinants of Health   Financial Resource Strain:   . Difficulty of Paying Living Expenses:   Food Insecurity:   . Worried About Programme researcher, broadcasting/film/video in the Last Year:   . Barista in the Last Year:   Transportation Needs:   .  Freight forwarder (Medical):   Marland Kitchen Lack of Transportation (Non-Medical):   Physical Activity:   . Days of Exercise per Week:   . Minutes of Exercise per Session:   Stress:   . Feeling of Stress :   Social Connections:   . Frequency of Communication with Friends and Family:   . Frequency of Social Gatherings with Friends and Family:   . Attends Religious Services:   . Active Member of Clubs or Organizations:   . Attends Banker Meetings:   Marland Kitchen Marital Status:   Intimate Partner Violence:   . Fear of Current or Ex-Partner:   . Emotionally Abused:   Marland Kitchen Physically Abused:   . Sexually Abused:      PHYSICAL EXAM:  VS: BP 110/69   Pulse 80   Ht 5\' 9"  (1.753 m)   Wt 200 lb (90.7 kg)   BMI 29.53 kg/m  Physical Exam Gen: NAD, alert, cooperative with exam, well-appearing MSK:  Right and left hand: No signs of atrophy. Normal strength resistance. Normal range of motion. Right upper arm. Biceps does not function.   Distal bicep insertion was intact. Neurovascularly intact  Limited ultrasound: Right and left wrist:  Left  wrist: The median nerve in the carpal tunnel was measured to be normal. No changes of the [facility] joint. No effusion noted in the carpal joints.  Right wrist: Normal-appearing median nerve in the carpal tunnel.  Summary: Clinical exam would suggest more carpal tunnel but median nerve was measured to be normal.  Ultrasound and interpretation by Clare Gandy, MD   Aspiration/Injection Procedure Note Richard Valdez 1980/08/13  Procedure: Injection Indications: Left carpal tunnel syndrome  Procedure Details Consent: Risks of procedure as well as the alternatives and risks of each were explained to the (patient/caregiver).  Consent for procedure obtained. Time Out: Verified patient identification, verified procedure, site/side was marked, verified correct patient position, special equipment/implants available, medications/allergies/relevent  history reviewed, required imaging and test results available.  Performed.  The area was cleaned with iodine and alcohol swabs.    The Left carpal tunnel was injected using 1 cc's of 40 mg Depo-Medrol and 2 cc's of 0.25% bupivacaine with a 22 1 1/2" needle.  Ultrasound was used. Images were obtained in long views showing the injection.     A sterile dressing was applied.  Patient did tolerate procedure well.    ASSESSMENT & PLAN:   Strain of right biceps muscle His biceps muscle itself does not function when he is initiates contraction.  Has some atrophy associated with it.   -Counseled on supportive care. -May need evaluate the cervical spine as a source.  May need to consider EMG.   Paresthesia of both hands Occurring bilaterally.  Seems carpal tunnel in nature but does occur on the dorsum of the hand.  Worse at night.  No signs of atrophy.  Median nerve is noted to be normal on ultrasound. -Counseled on home exercise therapy and supportive care. -Left carpal tunnel injection today. -CBC, CMP, B12 and folate, A1c and TSH. -May need to consider EMG.

## 2019-10-23 NOTE — Assessment & Plan Note (Signed)
His biceps muscle itself does not function when he is initiates contraction.  Has some atrophy associated with it.   -Counseled on supportive care. -May need evaluate the cervical spine as a source.  May need to consider EMG.

## 2019-10-23 NOTE — Patient Instructions (Signed)
Good  to see you Please try the exercises  I will call with the results from today  Please send me a message in MyChart with any questions or updates.  Please see me back in 4 weeks.   --Dr. Jordan Likes

## 2019-10-24 LAB — HEMOGLOBIN A1C
Est. average glucose Bld gHb Est-mCnc: 108 mg/dL
Hgb A1c MFr Bld: 5.4 % (ref 4.8–5.6)

## 2019-10-24 LAB — CBC WITH DIFFERENTIAL
Basophils Absolute: 0.1 10*3/uL (ref 0.0–0.2)
Basos: 0 %
EOS (ABSOLUTE): 0.1 10*3/uL (ref 0.0–0.4)
Eos: 1 %
Hematocrit: 40.7 % (ref 37.5–51.0)
Hemoglobin: 13.6 g/dL (ref 13.0–17.7)
Immature Grans (Abs): 0 10*3/uL (ref 0.0–0.1)
Immature Granulocytes: 0 %
Lymphocytes Absolute: 4.5 10*3/uL — ABNORMAL HIGH (ref 0.7–3.1)
Lymphs: 39 %
MCH: 30.5 pg (ref 26.6–33.0)
MCHC: 33.4 g/dL (ref 31.5–35.7)
MCV: 91 fL (ref 79–97)
Monocytes Absolute: 0.7 10*3/uL (ref 0.1–0.9)
Monocytes: 6 %
Neutrophils Absolute: 6.2 10*3/uL (ref 1.4–7.0)
Neutrophils: 54 %
RBC: 4.46 x10E6/uL (ref 4.14–5.80)
RDW: 12 % (ref 11.6–15.4)
WBC: 11.5 10*3/uL — ABNORMAL HIGH (ref 3.4–10.8)

## 2019-10-24 LAB — COMPREHENSIVE METABOLIC PANEL
ALT: 21 IU/L (ref 0–44)
AST: 14 IU/L (ref 0–40)
Albumin/Globulin Ratio: 1.6 (ref 1.2–2.2)
Albumin: 4.2 g/dL (ref 4.0–5.0)
Alkaline Phosphatase: 86 IU/L (ref 48–121)
BUN/Creatinine Ratio: 19 (ref 9–20)
BUN: 16 mg/dL (ref 6–20)
Bilirubin Total: <0.2 mg/dL (ref 0.0–1.2)
CO2: 28 mmol/L (ref 20–29)
Calcium: 9.2 mg/dL (ref 8.7–10.2)
Chloride: 102 mmol/L (ref 96–106)
Creatinine, Ser: 0.86 mg/dL (ref 0.76–1.27)
GFR calc Af Amer: 126 mL/min/{1.73_m2} (ref >59)
GFR calc non Af Amer: 109 mL/min/{1.73_m2} (ref >59)
Globulin, Total: 2.6 g/dL (ref 1.5–4.5)
Glucose: 92 mg/dL (ref 65–99)
Potassium: 4.2 mmol/L (ref 3.5–5.2)
Sodium: 141 mmol/L (ref 134–144)
Total Protein: 6.8 g/dL (ref 6.0–8.5)

## 2019-10-24 LAB — B12 AND FOLATE PANEL
Folate: 3.8 ng/mL (ref >3.0)
Vitamin B-12: 932 pg/mL (ref 232–1245)

## 2019-10-24 LAB — TSH: TSH: 1.94 u[IU]/mL (ref 0.450–4.500)

## 2019-10-27 ENCOUNTER — Telehealth: Payer: Self-pay | Admitting: Family Medicine

## 2019-10-27 DIAGNOSIS — R202 Paresthesia of skin: Secondary | ICD-10-CM

## 2019-10-27 MED ORDER — GABAPENTIN 300 MG PO CAPS: 300.0000 mg | ORAL_CAPSULE | Freq: Three times a day (TID) | ORAL | 1 refills | Status: DC

## 2019-10-27 NOTE — Telephone Encounter (Signed)
Informed of results.  Has a mild leukocytosis and lymphocytosis.  Will need to follow-up on this in a month or 2.  Will refer for EMG.  Will provide gabapentin.  Myra Rude, MD [facility] 10/27/2019, 8:18 AM

## 2019-11-20 ENCOUNTER — Ambulatory Visit: Payer: 59 | Admitting: Family Medicine

## 2019-11-20 NOTE — Progress Notes (Deleted)
  Richard Valdez - 38 y.o. male MRN [id number]  Date of birth: 12/24/1980  SUBJECTIVE:  Including CC & ROS.  No chief complaint on file.   Richard Valdez is a 38 y.o. male that is  ***.  ***   Review of Systems See HPI   HISTORY: Past Medical, Surgical, Social, and Family History Reviewed & Updated per EMR.   Pertinent Historical Findings include:  No past medical history on file.  Past Surgical History:  Procedure Laterality Date  . HERNIA REPAIR    . I & D EXTREMITY Right 10/29/2012   Procedure: Excisional DEBRIDEMENT with Extensor Tendon Repair Right Long and Ring Fingers;  Surgeon: David A Thompson, MD;  Location: [facility] OR;  Service: Orthopedics;  Laterality: Right;    Family History  Problem Relation Age of Onset  . Hypertension Mother   . Hypertension Father     Social History   Socioeconomic History  . Marital status: Single    Spouse name: Not on file  . Number of children: Not on file  . Years of education: Not on file  . Highest education level: Not on file  Occupational History  . Not on file  Tobacco Use  . Smoking status: Current Every Day Smoker    Packs/day: 2.50    Years: 18.00    Pack years: 45.00    Types: Cigarettes  . Smokeless tobacco: Never Used  Substance and Sexual Activity  . Alcohol use: Yes    Comment: occasional   . Drug use: No  . Sexual activity: Yes  Other Topics Concern  . Not on file  Social History Narrative  . Not on file   Social Determinants of Health   Financial Resource Strain:   . Difficulty of Paying Living Expenses:   Food Insecurity:   . Worried About Running Out of Food in the Last Year:   . Ran Out of Food in the Last Year:   Transportation Needs:   . Lack of Transportation (Medical):   . Lack of Transportation (Non-Medical):   Physical Activity:   . Days of Exercise per Week:   . Minutes of Exercise per Session:   Stress:   . Feeling of Stress :   Social Connections:   . Frequency of Communication  with Friends and Family:   . Frequency of Social Gatherings with Friends and Family:   . Attends Religious Services:   . Active Member of Clubs or Organizations:   . Attends Club or Organization Meetings:   . Marital Status:   Intimate Partner Violence:   . Fear of Current or Ex-Partner:   . Emotionally Abused:   . Physically Abused:   . Sexually Abused:      PHYSICAL EXAM:  VS: There were no vitals taken for this visit. Physical Exam Gen: NAD, alert, cooperative with exam, well-appearing MSK:  ***      ASSESSMENT & PLAN:   No problem-specific Assessment & Plan notes found for this encounter.     

## 2019-11-30 ENCOUNTER — Ambulatory Visit: Payer: 59 | Admitting: Family Medicine

## 2019-11-30 NOTE — Progress Notes (Deleted)
  MARLAND REINE - 39 y.o. male MRN [id number]  Date of birth: 01/29/1981  SUBJECTIVE:  Including CC & ROS.  No chief complaint on file.   UDELL MAZZOCCO is a 39 y.o. male that is  ***.  ***   Review of Systems See HPI   HISTORY: Past Medical, Surgical, Social, and Family History Reviewed & Updated per EMR.   Pertinent Historical Findings include:  No past medical history on file.  Past Surgical History:  Procedure Laterality Date  . HERNIA REPAIR    . I & D EXTREMITY Right 10/29/2012   Procedure: Excisional DEBRIDEMENT with Extensor Tendon Repair Right Long and Ring Fingers;  Surgeon: [name], MD;  Location: [facility] OR;  Service: Orthopedics;  Laterality: Right;    Family History  Problem Relation Age of Onset  . Hypertension Mother   . Hypertension Father     Social History   Socioeconomic History  . Marital status: Single    Spouse name: Not on file  . Number of children: Not on file  . Years of education: Not on file  . Highest education level: Not on file  Occupational History  . Not on file  Tobacco Use  . Smoking status: Current Every Day Smoker    Packs/day: 2.50    Years: 18.00    Pack years: 45.00    Types: Cigarettes  . Smokeless tobacco: Never Used  Substance and Sexual Activity  . Alcohol use: Yes    Comment: occasional   . Drug use: No  . Sexual activity: Yes  Other Topics Concern  . Not on file  Social History Narrative  . Not on file   Social Determinants of Health   Financial Resource Strain:   . Difficulty of Paying Living Expenses:   Food Insecurity:   . Worried About Programme researcher, broadcasting/film/video in the Last Year:   . Barista in the Last Year:   Transportation Needs:   . Freight forwarder (Medical):   Marland Kitchen Lack of Transportation (Non-Medical):   Physical Activity:   . Days of Exercise per Week:   . Minutes of Exercise per Session:   Stress:   . Feeling of Stress :   Social Connections:   . Frequency of Communication  with Friends and Family:   . Frequency of Social Gatherings with Friends and Family:   . Attends Religious Services:   . Active Member of Clubs or Organizations:   . Attends Banker Meetings:   Marland Kitchen Marital Status:   Intimate Partner Violence:   . Fear of Current or Ex-Partner:   . Emotionally Abused:   Marland Kitchen Physically Abused:   . Sexually Abused:      PHYSICAL EXAM:  VS: There were no vitals taken for this visit. Physical Exam Gen: NAD, alert, cooperative with exam, well-appearing MSK:  ***      ASSESSMENT & PLAN:   No problem-specific Assessment & Plan notes found for this encounter.

## 2019-12-03 ENCOUNTER — Encounter: Payer: Self-pay | Admitting: Family Medicine

## 2019-12-03 ENCOUNTER — Ambulatory Visit: Payer: 59 | Admitting: Family Medicine

## 2019-12-03 ENCOUNTER — Other Ambulatory Visit: Payer: Self-pay

## 2019-12-03 DIAGNOSIS — R202 Paresthesia of skin: Secondary | ICD-10-CM

## 2019-12-03 MED ORDER — GABAPENTIN 600 MG PO TABS
600.0000 mg | ORAL_TABLET | Freq: Three times a day (TID) | ORAL | 1 refills | Status: DC
Start: 2019-12-03 — End: 2019-12-21

## 2019-12-03 NOTE — Progress Notes (Signed)
°  Richard Valdez - 39 y.o. male MRN [id number]  Date of birth: Aug 23, 1980  SUBJECTIVE:  Including CC & ROS.  No chief complaint on file.   Richard Valdez is a 39 y.o. male that is following up for his ongoing hand pain.  He tends to have fasciculations in the left and right forearm.  He also has cramping of his first and second digit of each hand.   Review of Systems See HPI   HISTORY: Past Medical, Surgical, Social, and Family History Reviewed & Updated per EMR.   Pertinent Historical Findings include:  No past medical history on file.  Past Surgical History:  Procedure Laterality Date   HERNIA REPAIR     I & D EXTREMITY Right 10/29/2012   Procedure: Excisional DEBRIDEMENT with Extensor Tendon Repair Right Long and Ring Fingers;  Surgeon: [name], MD;  Location: [facility] OR;  Service: Orthopedics;  Laterality: Right;    Family History  Problem Relation Age of Onset   Hypertension Mother    Hypertension Father     Social History   Socioeconomic History   Marital status: Single    Spouse name: Not on file   Number of children: Not on file   Years of education: Not on file   Highest education level: Not on file  Occupational History   Not on file  Tobacco Use   Smoking status: Current Every Day Smoker    Packs/day: 2.50    Years: 18.00    Pack years: 45.00    Types: Cigarettes   Smokeless tobacco: Never Used  Substance and Sexual Activity   Alcohol use: Yes    Comment: occasional    Drug use: No   Sexual activity: Yes  Other Topics Concern   Not on file  Social History Narrative   Not on file   Social Determinants of Health   Financial Resource Strain:    Difficulty of Paying Living Expenses:   Food Insecurity:    Worried About Programme researcher, broadcasting/film/video in the Last Year:    Barista in the Last Year:   Transportation Needs:    Freight forwarder (Medical):    Lack of Transportation (Non-Medical):   Physical Activity:     Days of Exercise per Week:    Minutes of Exercise per Session:   Stress:    Feeling of Stress :   Social Connections:    Frequency of Communication with Friends and Family:    Frequency of Social Gatherings with Friends and Family:    Attends Religious Services:    Active Member of Clubs or Organizations:    Attends Engineer, structural:    Marital Status:   Intimate Partner Violence:    Fear of Current or Ex-Partner:    Emotionally Abused:    Physically Abused:    Sexually Abused:      PHYSICAL EXAM:  VS: BP 110/70    Ht 5\' 8"  (1.727 m)    Wt 200 lb (90.7 kg)    BMI 30.41 kg/m  Physical Exam Gen: NAD, alert, cooperative with exam, well-appearing   ASSESSMENT & PLAN:   Paresthesia of both hands Symptoms are ongoing.  Appears to happen regardless of work.  Does seem to be worse at the end of the day.  Has gotten some relief with gabapentin. -Counseled supportive care. -Has his nerve study for next week. -Increase gabapentin.

## 2019-12-03 NOTE — Patient Instructions (Signed)
Good to see you Please try the increased dose of gabapentin   Please send me a message in MyChart with any questions or updates.  I will call after I get the results of the nerve study .   --Dr. Jordan Likes

## 2019-12-03 NOTE — Assessment & Plan Note (Signed)
Symptoms are ongoing.  Appears to happen regardless of work.  Does seem to be worse at the end of the day.  Has gotten some relief with gabapentin. -Counseled supportive care. -Has his nerve study for next week. -Increase gabapentin.

## 2019-12-11 ENCOUNTER — Ambulatory Visit: Payer: Self-pay | Admitting: Neurology

## 2019-12-21 ENCOUNTER — Telehealth: Payer: Self-pay | Admitting: Family Medicine

## 2019-12-21 MED ORDER — GABAPENTIN 300 MG PO CAPS: 300.0000 mg | ORAL_CAPSULE | Freq: Three times a day (TID) | ORAL | 1 refills | Status: DC

## 2019-12-21 NOTE — Telephone Encounter (Signed)
Patient called states the 600MG  of gabapentin is causing continuous diarrhea & he would like to switch back down to the (300MG ) .   ---Pt also wishes new Rx to be sent to :   [id number] - [place] ([place]), - [address] [place] [address] Phone:  [phone]  Fax:  [phone]     Please contact patient if there are any questions or concerns [phone].  --glh

## 2019-12-21 NOTE — Telephone Encounter (Signed)
Provide gabapentin 300 due to intolerance to 600 mg.  Myra Rude, MD [facility] 12/21/2019, 2:44 PM

## 2020-01-07 ENCOUNTER — Other Ambulatory Visit: Payer: Self-pay

## 2020-01-07 ENCOUNTER — Encounter: Payer: Self-pay | Admitting: Neurology

## 2020-01-07 ENCOUNTER — Ambulatory Visit (INDEPENDENT_AMBULATORY_CARE_PROVIDER_SITE_OTHER): Payer: 59 | Admitting: Neurology

## 2020-01-07 VITALS — BP 112/80 | HR 91 | Ht 68.0 in | Wt 205.0 lb

## 2020-01-07 DIAGNOSIS — M79641 Pain in right hand: Secondary | ICD-10-CM

## 2020-01-07 DIAGNOSIS — M79642 Pain in left hand: Secondary | ICD-10-CM | POA: Diagnosis not present

## 2020-01-07 DIAGNOSIS — G5603 Carpal tunnel syndrome, bilateral upper limbs: Secondary | ICD-10-CM

## 2020-01-07 DIAGNOSIS — G5601 Carpal tunnel syndrome, right upper limb: Secondary | ICD-10-CM | POA: Insufficient documentation

## 2020-01-07 MED ORDER — GABAPENTIN 400 MG PO CAPS: 400.0000 mg | ORAL_CAPSULE | Freq: Three times a day (TID) | ORAL | 3 refills | Status: DC

## 2020-01-07 MED ORDER — GABAPENTIN 300 MG PO CAPS: 300.0000 mg | ORAL_CAPSULE | Freq: Three times a day (TID) | ORAL | 1 refills | Status: DC

## 2020-01-07 NOTE — Patient Instructions (Addendum)
Wear wrist splints   Carpal Tunnel Syndrome  Carpal tunnel syndrome is a condition that causes pain in your hand and arm. The carpal tunnel is a narrow area that is on the palm side of your wrist. Repeated wrist motion or certain diseases may cause swelling in the tunnel. This swelling can pinch the main nerve in the wrist (median nerve). What are the causes? This condition may be caused by:  Repeated wrist motions.  Wrist injuries.  Arthritis.  A sac of fluid (cyst) or abnormal growth (tumor) in the carpal tunnel.  Fluid buildup during pregnancy. Sometimes the cause is not known. What increases the risk? The following factors may make you more likely to develop this condition:  Having a job in which you move your wrist in the same way many times. This includes jobs like being a Midwife or a Conservation officer, nature.  Being a woman.  Having other health conditions, such as: ? Diabetes. ? Obesity. ? A thyroid gland that is not active enough (hypothyroidism). ? Kidney failure. What are the signs or symptoms? Symptoms of this condition include:  A tingling feeling in your fingers.  Tingling or a loss of feeling (numbness) in your hand.  Pain in your entire arm. This pain may get worse when you bend your wrist and elbow for a long time.  Pain in your wrist that goes up your arm to your shoulder.  Pain that goes down into your palm or fingers.  A weak feeling in your hands. You may find it hard to grab and hold items. You may feel worse at night. How is this diagnosed? This condition is diagnosed with a medical history and physical exam. You may also have tests, such as:  Electromyogram (EMG). This test checks the signals that the nerves send to the muscles.  Nerve conduction study. This test checks how well signals pass through your nerves.  Imaging tests, such as X-rays, ultrasound, and MRI. These tests check for what might be the cause of your condition. How is this treated? This  condition may be treated with:  Lifestyle changes. You will be asked to stop or change the activity that caused your problem.  Doing exercise and activities that make bones and muscles stronger (physical therapy).  Learning how to use your hand again (occupational therapy).  Medicines for pain and swelling (inflammation). You may have injections in your wrist.  A wrist splint.  Surgery. Follow these instructions at home: If you have a splint:  Wear the splint as told by your doctor. Remove it only as told by your doctor.  Loosen the splint if your fingers: ? Tingle. ? Lose feeling (become numb). ? Turn cold and blue.  Keep the splint clean.  If the splint is not waterproof: ? Do not let it get wet. ? Cover it with a watertight covering when you take a bath or a shower. Managing pain, stiffness, and swelling   If told, put ice on the painful area: ? If you have a removable splint, remove it as told by your doctor. ? Put ice in a plastic bag. ? Place a towel between your skin and the bag. ? Leave the ice on for 20 minutes, 2-3 times per day. General instructions  Take over-the-counter and prescription medicines only as told by your doctor.  Rest your wrist from any activity that may cause pain. If needed, talk with your boss at work about changes that can help your wrist heal.  Do any exercises  as told by your doctor, physical therapist, or occupational therapist.  Keep all follow-up visits as told by your doctor. This is important. Contact a doctor if:  You have new symptoms.  Medicine does not help your pain.  Your symptoms get worse. Get help right away if:  You have very bad numbness or tingling in your wrist or hand. Summary  Carpal tunnel syndrome is a condition that causes pain in your hand and arm.  It is often caused by repeated wrist motions.  Lifestyle changes and medicines are used to treat this problem. Surgery may help in very bad  cases.  Follow your doctor's instructions about wearing a splint, resting your wrist, keeping follow-up visits, and calling for help. This information is not intended to replace advice given to you by your health care provider. Make sure you discuss any questions you have with your health care provider. Document Revised: 08/16/2017 Document Reviewed: 08/16/2017 Elsevier Patient Education  2020 [employer].

## 2020-01-07 NOTE — Progress Notes (Signed)
[facility]    Provider:  Dr Lucia Gaskins Requesting Provider: Myra Rude, MD (saw patient twice, in urgent care and in an office visit, patient is following with this physician) Primary Care Provider:  Myra Rude, MD  CC:  Pain in hands  HPI:  Richard Valdez is a 39 y.o. male here as requested by Myra Rude, MD for paresthesia of both hands. PMHX paresthesia of both hands  . 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  Examination showed no signs of atrophy, no triggering, some warmth appreciated over the carpal bones in the left, unable to make a complete fist on the left, distal biceps tendon is intact and able to fire the biceps tendon, neurovascularly intact.  Dr. Jordan Likes thought that it appeared to be more carpal tunnel related, as he works with his hands a Curator and the symptoms have been ongoing for a long period of time.  He provided a left wrist brace and prednisone and counseled on follow-up.  The past year or 2, getting worse, locking up, cramping, numbness and they really hurt and cramp up. It is worse with sitting in his recliner at home. Last night had to sit on hands it hurt so much. He doesn't think he has weakness. Position changes will help. Gabapentin helps. He is right handed. His left hand is worse. Using hands all day he struggles but it does it every day and progressively worsening especially when he wakes up in the morning, he did not get the wrist splints. He saw Dr. Jordan Likes in his  office. No neck pain, no radicular symptoms, horrible in the morning he can;t even light his lighter. :Like they are asleep. He thinks he has arthritis. He is worried about someone breaking into his house and he can't use his hands. He denies any symptoms I his feet or other weakness. Mostly fingers 1-3. No other focal neurologic deficits, associated symptoms, inciting events or modifiable factors.  Reviewed notes, labs and imaging from outside physicians, which showed:   B12 and folate were normal, TSH normal, hemoglobin A1c normal CBC unremarkable with slightly elevated white blood cells and CMP normal all taken October 23, 2019.  Review of Systems: Patient complains of symptoms per HPI as well as the following symptoms: hand pain. Pertinent negatives and positives per HPI. All others negative.   Social History   Socioeconomic History  . Marital status: Single    Spouse name: Not on file  . Number of children: Not on file  . Years of education: Not on file  . Highest education level: Not on file  Occupational History  . Not on file  Tobacco Use  . Smoking status: Current Every Day Smoker    Packs/day: 2.50    Years: 18.00    Pack years: 45.00    Types: Cigarettes  . Smokeless tobacco: Never Used  . Tobacco comment: maybe 1ppd now as of 01/07/20  Vaping Use  . Vaping Use: Never used  Substance and  Sexual Activity  . Alcohol use: Yes    Comment: "very seldom"  . Drug use: No  . Sexual activity: Yes  Other Topics Concern  . Not on file  Social History Narrative   Lives at home with his fiance   Right handed   Caffeine: about 48 oz of Mtn Dew per day   Social Determinants of Health   Financial Resource Strain:   . Difficulty of Paying Living Expenses: Not on file  Food Insecurity:   . Worried About Programme researcher, broadcasting/film/video in the Last Year: Not on file  . Ran Out of Food in the Last Year: Not on file  Transportation Needs:   . Lack of Transportation (Medical): Not on file  .  Lack of Transportation (Non-Medical): Not on file  Physical Activity:   . Days of Exercise per Week: Not on file  . Minutes of Exercise per Session: Not on file  Stress:   . Feeling of Stress : Not on file  Social Connections:   . Frequency of Communication with Friends and Family: Not on file  . Frequency of Social Gatherings with Friends and Family: Not on file  . Attends Religious Services: Not on file  . Active Member of Clubs or Organizations: Not on file  . Attends Banker Meetings: Not on file  . Marital Status: Not on file  Intimate Partner Violence:   . Fear of Current or Ex-Partner: Not on file  . Emotionally Abused: Not on file  . Physically Abused: Not on file  . Sexually Abused: Not on file    Family History  Problem Relation Age of Onset  . Hypertension Mother   . Hypertension Father     History reviewed. No pertinent past medical history.  Patient Active Problem List   Diagnosis Date Noted  . Bilateral carpal tunnel syndrome 01/07/2020  . Paresthesia of both hands 10/23/2019  . Strain of right biceps muscle 10/23/2019    Past Surgical History:  Procedure Laterality Date  . HERNIA REPAIR    . I & D EXTREMITY Right 10/29/2012   Procedure: Excisional DEBRIDEMENT with Extensor Tendon Repair Right Long and Ring Fingers;  Surgeon: [name], MD;  Location: [facility] OR;  Service: Orthopedics;  Laterality: Right;    Current Outpatient Medications  Medication Sig Dispense Refill  . acetaminophen (TYLENOL) 500 MG tablet Take 1,000 mg by mouth every 6 (six) hours as needed for moderate pain.    . Buprenorphine HCl-Naloxone HCl (SUBOXONE) 8-2 MG FILM Place 2 each under the tongue daily.     Marland Kitchen ibuprofen (ADVIL,MOTRIN) 600 MG tablet Take 1 tablet (600 mg total) by mouth every 6 (six) hours as needed. 30 tablet 0  . gabapentin (NEURONTIN) 400 MG capsule Take 1 capsule (400 mg total) by mouth 3 (three) times daily. 90 capsule 3   No current  facility-administered medications for this visit.    Allergies as of 01/07/2020  . (No Known Allergies)    Vitals: BP 112/80 (BP Location: Right Arm, Patient Position: Sitting, Cuff Size: Large)   Pulse 91   Ht 5\' 8"  (1.727 m)   Wt 205 lb (93 kg)   BMI 31.17 kg/m  Last Weight:  Wt Readings from Last 1 Encounters:  01/07/20 205 lb (93 kg)   Last Height:   Ht Readings from Last 1 Encounters:  01/07/20 5\' 8"  (1.727 m)     Physical exam: Exam: Gen: NAD, conversant, well nourised, obese, well groomed  CV: RRR, no MRG. No Carotid Bruits. Mild swelling in the bilateral hands, otherwise peripheral edema, warm, nontender Eyes: Conjunctivae clear without exudates or hemorrhage  Neuro: Detailed Neurologic Exam  Speech:    Speech is normal; fluent and spontaneous with normal comprehension.  Cognition:    The patient is oriented to person, place, and time;     recent and remote memory intact;     language fluent;     normal attention, concentration,     fund of knowledge Cranial Nerves:    The pupils are equal, round, and reactive to light. Attempted, could not visualize fundi.  Visual fields are full to finger confrontation. Extraocular movements are intact. Trigeminal sensation is intact and the muscles of mastication are normal. The face is symmetric. The palate elevates in the midline. Hearing intact. Voice is normal. Shoulder shrug is normal. The tongue has normal motion without fasciculations.   Coordination:    No dysmetria or ataxia  Gait:    Normal native gait  Motor Observation:    No asymmetry, no atrophy, and no involuntary movements noted. Tone:    Normal muscle tone.    Posture:    Posture is normal. normal erect    Strength: weak grip and opponens pollicis weakness bilat left >> right. Otherwise strength is V/V in the upper and lower limbs.      Sensation: intact to LT     Reflex Exam:  DTR's:    Deep tendon reflexes in the upper  and lower extremities are normal bilaterally.   Toes:    The toes are downgoing bilaterally.   Clonus:    Clonus is absent.    Assessment/Plan:  39 year old male with likely CTS it is very painful will try and get him to emg/ncs as soon as possible. Discussed conservative measures, advised to wear a wrist splint. Will increase gabapentin.   Orders Placed This Encounter  Procedures  . NCV with EMG(electromyography)   Meds ordered this encounter  Medications  . DISCONTD: gabapentin (NEURONTIN) 300 MG capsule    Sig: Take 1 capsule (300 mg total) by mouth 3 (three) times daily. One tab PO qHS for a week, then BID for a week, then TID. May double weekly to a max of 3,600mg /day    Dispense:  90 capsule    Refill:  1  . gabapentin (NEURONTIN) 400 MG capsule    Sig: Take 1 capsule (400 mg total) by mouth 3 (three) times daily.    Dispense:  90 capsule    Refill:  3     Cc: Myra Rude, MD  Naomie Dean, MD  [facility] [address] [place], [place] [address]  Phone [phone] Fax [phone]

## 2020-01-20 NOTE — Progress Notes (Addendum)
39 year old male with pain in both hands. He has more pain than paresthesias. He does feel like they do go to sleep. He does endorse shooting pains into digits 4-5 which corresponds to the Ulnar neuropathy. He has cramping in digits 1-3 and pain in the top of the hand and fingers left > right. Left worse than right. Rubbing some cream on the top of the left hand helps.  He wonders if this is at least partially due to arthritis.  Wearing a brace on left wrist for a week during vacation significantly improved symptoms. Will get an xray of the left hand. Send to a Hydrographic surveyor. Increase Gabapentin which helps. 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 May be too mild to appreciate on testing but he may have left CTS as well. Send to Hydrographic surveyor.  Orders Placed This Encounter  Procedures  . DG Hand Complete Left  . 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 Pain in both hands   3. Median nerve neuropathy, unspecified laterality   4. Bilateral carpal tunnel syndrome    diagnosis.  This included previsit chart review, lab review, study review, order entry, electronic health record documentation, patient education on the different diagnostic and therapeutic options, counseling and coordination of care, risks and benefits of management, compliance, or risk factor reduction. This does not include time spent on emg/ncs

## 2020-01-21 ENCOUNTER — Ambulatory Visit (INDEPENDENT_AMBULATORY_CARE_PROVIDER_SITE_OTHER): Payer: 59 | Admitting: Neurology

## 2020-01-21 DIAGNOSIS — G5623 Lesion of ulnar nerve, bilateral upper limbs: Secondary | ICD-10-CM | POA: Diagnosis not present

## 2020-01-21 DIAGNOSIS — G5603 Carpal tunnel syndrome, bilateral upper limbs: Secondary | ICD-10-CM

## 2020-01-21 DIAGNOSIS — G5600 Carpal tunnel syndrome, unspecified upper limb: Secondary | ICD-10-CM

## 2020-01-21 DIAGNOSIS — G5610 Other lesions of median nerve, unspecified upper limb: Secondary | ICD-10-CM

## 2020-01-21 DIAGNOSIS — M79642 Pain in left hand: Secondary | ICD-10-CM

## 2020-01-21 DIAGNOSIS — Z0289 Encounter for other administrative examinations: Secondary | ICD-10-CM

## 2020-01-21 DIAGNOSIS — M79641 Pain in right hand: Secondary | ICD-10-CM | POA: Diagnosis not present

## 2020-01-21 MED ORDER — GABAPENTIN 600 MG PO TABS: 600.0000 mg | ORAL_TABLET | Freq: Three times a day (TID) | ORAL | 3 refills | Status: DC

## 2020-01-21 NOTE — Progress Notes (Addendum)
Full Name: Richard Valdez Gender: Male MRN #: [id number] Date of Birth: [birth year]    Visit Date: 01/21/2020 09:58 Age: 39 Years Examining Physician: Naomie Dean, MD  Referring Physician: Clare Gandy, MD Height: 5 feet 8 inch    History: Pain in hands. They feel like they go to sleep. Digits 1-3 with cramping bilaterally. He feels electric shocks radiating from the elbow to digits 4-5 bilaterally.   Summary: EMG/NCS was performed on the bilateral upper extremities. Evaluation of the left Ulnar ADM motor nerve showed decreased conduction velocity (A Elbow-B Elbow, 8m/s, N>49) with a 4m/s drop across the elbow section (N<65m/s).  Evaluation of the right Ulnar ADM motor nerve showed decreased conduction velocity (A Elbow-B Elbow, 16m/s, N>49) with a 53m/s drop across the elbow section (N<74m/s).  The right Ulnar 5th digit orthodromic sensory nerve showed reduced amplitude (6.7 V, N>10).  The right Median 2nd Digit orthodromic sensory nerve showed reduced amplitude(3uV, N>5)F Wave studies indicate that the right Ulnar F wave has delayed latency(32.8, N<73ms).The right median/ulnar (palm) comparison nerve showed prolonged distal peak latency (Median Palm, 2.3 ms, N<2.2) and abnormal peak latency difference (Median Palm-Ulnar Palm, 0.4 ms, N<0.4) with a relative median delay.  All remaining nerves (as indicated in the following tables) were within normal limits.  All muscles (as indicated in the following tables) were within normal limits.      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 There is concomitant right Carpal Tunnel syndrome. Given clinical symptoms there may be Carpal Tunnel Syndrome of the left wrist that is not appreciated on this study.   Naomie Dean M.D.  [facility] [address], [address] [place], [place] [address] Tel: [phone] Fax: [phone]  Verbal informed consent was obtained from the patient, patient was informed  of potential risk of procedure, including bruising, bleeding, hematoma formation, infection, muscle weakness, muscle pain, numbness, among others.        MNC    Nerve / Sites Muscle Latency Ref. Amplitude Ref. Rel Amp Segments Distance Velocity Ref. Area    ms ms mV mV %  cm m/s m/s mVms  R Median - APB     Wrist APB 3.9 ?4.4 6.8 ?4.0 100 Wrist - APB 7   20.5     Upper arm APB 7.9  7.1  105 Upper arm - Wrist 22 56 ?49 22.7  L Median - APB     Wrist APB 3.5 ?4.4 7.6 ?4.0 100 Wrist - APB 7   18.5     Upper arm APB 7.4  7.3  96 Upper arm - Wrist 22 56 ?49 17.4  R Ulnar - ADM     Wrist ADM 2.5 ?3.3 10.3 ?6.0 100 Wrist - ADM 7   23.9     B.Elbow ADM 6.4  9.5  92.3 B.Elbow - Wrist 21 53 ?49 24.7     A.Elbow ADM 9.9  8.6  90.3 A.Elbow - B.Elbow 10 29 ?49 22.7  L Ulnar - ADM     Wrist ADM 2.5 ?3.3 8.5 ?6.0 100 Wrist - ADM 7   20.9     B.Elbow ADM 6.3  7.5  87.2 B.Elbow - Wrist 20 53 ?49 20.0     A.Elbow ADM 9.7  6.0  79.9 A.Elbow - B.Elbow 10 29 ?49 21.4             SNC    Nerve / Sites Rec. Site Peak Lat Ref.  Amp Ref. Segments Distance Peak Diff Ref.    ms ms V V  cm ms ms  R Radial - Anatomical snuff box (Forearm)     Forearm Wrist 2.2 ?2.9 17 ?15 Forearm - Wrist 10    L Radial - Anatomical snuff box (Forearm)     Forearm Wrist 2.1 ?2.9 18 ?15 Forearm - Wrist 10    R Median, Ulnar - Transcarpal comparison     Median Palm Wrist 2.3 ?2.2 40 ?35 Median Palm - Wrist 8       Ulnar Palm Wrist 1.9 ?2.2 6 ?12 Ulnar Palm - Wrist 8          Median Palm - Ulnar Palm  0.4 ?0.4  L Median, Ulnar - Transcarpal comparison     Median Palm Wrist 1.9 ?2.2 59 ?35 Median Palm - Wrist 8       Ulnar Palm Wrist 2.1 ?2.2 18 ?12 Ulnar Palm - Wrist 8          Median Palm - Ulnar Palm  -0.2 ?0.4  R Median - Orthodromic (Dig II, Mid palm)     Dig II Wrist 3.2 ?3.4 7 ?10 Dig II - Wrist 13    L Median - Orthodromic (Dig II, Mid palm)     Dig II Wrist 2.9 ?3.4 12 ?10 Dig II - Wrist 13    R Ulnar -  Orthodromic, (Dig V, Mid palm)     Dig V Wrist 2.6 ?3.1 3 ?5 Dig V - Wrist 11    L Ulnar - Orthodromic, (Dig V, Mid palm)     Dig V Wrist 2.8 ?3.1 7 ?5 Dig V - Wrist 30                       F  Wave    Nerve F Lat Ref.   ms ms  R Ulnar - ADM 32.8 ?32.0  L Ulnar - ADM 31.5 ?32.0         EMG Summary Table    Spontaneous MUAP Recruitment  Muscle IA Fib PSW Fasc Other Amp Dur. Poly Pattern  Bilat. Deltoid Normal None None None _______ Normal Normal Normal Normal  Bilat. Triceps brachii Normal None None None _______ Normal Normal Normal Normal  Bilat. Biceps brachii Normal None None None _______ Normal Normal Normal Normal  Bilat. Flexor digitorum profundus (Ulnar) Normal None None None _______ Normal Normal Normal Normal  Bilat. Pronator teres Normal None None None _______ Normal Normal Normal Normal  Bilat. First dorsal interosseous Normal None None None _______ Normal Normal Normal Normal  Bilat. Opponens pollicis Normal None None None _______ Normal Normal Normal Normal  Bilat. Extensor indicis proprius Normal None None None _______ Normal Normal Normal Normal

## 2020-01-21 NOTE — Progress Notes (Signed)
See procedure note.

## 2020-01-21 NOTE — Patient Instructions (Signed)
Carpal Tunnel Syndrome  Carpal tunnel syndrome is a condition that causes pain in your hand and arm. The carpal tunnel is a narrow area that is on the palm side of your wrist. Repeated wrist motion or certain diseases may cause swelling in the tunnel. This swelling can pinch the main nerve in the wrist (median nerve). What are the causes? This condition may be caused by:  Repeated wrist motions.  Wrist injuries.  Arthritis.  A sac of fluid (cyst) or abnormal growth (tumor) in the carpal tunnel.  Fluid buildup during pregnancy. Sometimes the cause is not known. What increases the risk? The following factors may make you more likely to develop this condition:  Having a job in which you move your wrist in the same way many times. This includes jobs like being a Software engineer or a Scientist, water quality.  Being a woman.  Having other health conditions, such as: ? Diabetes. ? Obesity. ? A thyroid gland that is not active enough (hypothyroidism). ? Kidney failure. What are the signs or symptoms? Symptoms of this condition include:  A tingling feeling in your fingers.  Tingling or a loss of feeling (numbness) in your hand.  Pain in your entire arm. This pain may get worse when you bend your wrist and elbow for a long time.  Pain in your wrist that goes up your arm to your shoulder.  Pain that goes down into your palm or fingers.  A weak feeling in your hands. You may find it hard to grab and hold items. You may feel worse at night. How is this diagnosed? This condition is diagnosed with a medical history and physical exam. You may also have tests, such as:  Electromyogram (EMG). This test checks the signals that the nerves send to the muscles.  Nerve conduction study. This test checks how well signals pass through your nerves.  Imaging tests, such as X-rays, ultrasound, and MRI. These tests check for what might be the cause of your condition. How is this treated? This condition may be  treated with:  Lifestyle changes. You will be asked to stop or change the activity that caused your problem.  Doing exercise and activities that make bones and muscles stronger (physical therapy).  Learning how to use your hand again (occupational therapy).  Medicines for pain and swelling (inflammation). You may have injections in your wrist.  A wrist splint.  Surgery. Follow these instructions at home: If you have a splint:  Wear the splint as told by your doctor. Remove it only as told by your doctor.  Loosen the splint if your fingers: ? Tingle. ? Lose feeling (become numb). ? Turn cold and blue.  Keep the splint clean.  If the splint is not waterproof: ? Do not let it get wet. ? Cover it with a watertight covering when you take a bath or a shower. Managing pain, stiffness, and swelling   If told, put ice on the painful area: ? If you have a removable splint, remove it as told by your doctor. ? Put ice in a plastic bag. ? Place a towel between your skin and the bag. ? Leave the ice on for 20 minutes, 2-3 times per day. General instructions  Take over-the-counter and prescription medicines only as told by your doctor.  Rest your wrist from any activity that may cause pain. If needed, talk with your boss at work about changes that can help your wrist heal.  Do any exercises as told by your  doctor, physical therapist, or occupational therapist.  Keep all follow-up visits as told by your doctor. This is important. Contact a doctor if:  You have new symptoms.  Medicine does not help your pain.  Your symptoms get worse. Get help right away if:  You have very bad numbness or tingling in your wrist or hand. Summary  Carpal tunnel syndrome is a condition that causes pain in your hand and arm.  It is often caused by repeated wrist motions.  Lifestyle changes and medicines are used to treat this problem. Surgery may help in very bad cases.  Follow your doctor's  instructions about wearing a splint, resting your wrist, keeping follow-up visits, and calling for help. This information is not intended to replace advice given to you by your health care provider. Make sure you discuss any questions you have with your health care provider. Document Revised: 08/16/2017 Document Reviewed: 08/16/2017 Elsevier Patient Education  2020 [employer]   Cubital Tunnel Syndrome  Cubital tunnel syndrome is a condition that causes pain and weakness of the forearm and hand. It happens when one of the nerves that runs along the inside of the elbow joint (ulnar nerve) becomes irritated. This condition is usually caused by repeated arm motions that are done during sports or work-related activities. What are the causes? This condition may be caused by:  Increased pressure on the ulnar nerve at the elbow, arm, or forearm. This can result from: ? Irritation caused by repeated elbow bending. ? Poorly healed elbow fractures. ? Tumors in the elbow. These are usually noncancerous (benign). ? Scar tissue that develops in the elbow after an injury. ? Bony growths (spurs) near the ulnar nerve.  Stretching of the nerve due to loose elbow ligaments.  Trauma to the nerve at the elbow. What increases the risk? The following factors may make you more likely to develop this condition:  Doing manual labor that requires frequent bending of the elbow.  Playing sports that include repeated or strenuous throwing motions, such as baseball.  Playing contact sports, such as football or lacrosse.  Not warming up properly before activities.  Having diabetes.  Having an underactive thyroid (hypothyroidism). What are the signs or symptoms? Symptoms of this condition include:  Clumsiness or weakness of the hand.  Tenderness of the inner elbow.  Aching or soreness of the inner elbow, forearm, or fingers, especially the little finger or the ring finger.  Increased pain when  forcing the elbow to bend.  Reduced control when throwing objects.  Tingling, numbness, or a burning feeling inside the forearm or in part of the hand or fingers, especially the little finger or the ring finger.  Sharp pains that shoot from the elbow down to the wrist and hand.  The inability to grip or pinch hard. How is this diagnosed? This condition is diagnosed based on:  Your symptoms and medical history. Your health care provider will also ask for details about any injury.  A physical exam. You may also have tests, including:  Electromyogram (EMG). This test measures electrical signals sent by your nerves into the muscles.  Nerve conduction study. This test measures how well electrical signals pass through your nerves.  Imaging tests, such as X-rays, ultrasound, and MRI. These tests check for possible causes of your condition. How is this treated? This condition may be treated by:  Stopping the activities that are causing your symptoms to get worse.  Icing and taking medicines to reduce pain and swelling.  Wearing  a splint to prevent your elbow from bending, or wearing an elbow pad where the ulnar nerve is closest to the skin.  Working with a physical therapist in less severe cases. This may help to: ? Decrease your symptoms. ? Improve the strength and range of motion of your elbow, forearm, and hand. If these treatments do not help, surgery may be needed. Follow these instructions at home: If you have a splint:  Wear the splint as told by your health care provider. Remove it only as told by your health care provider.  Loosen the splint if your fingers tingle, become numb, or turn cold and blue.  Keep the splint clean.  If the splint is not waterproof: ? Do not let it get wet. ? Cover it with a watertight covering when you take a bath or shower. Managing pain, stiffness, and swelling   If directed, put ice on the injured area: ? Put ice in a plastic  bag. ? Place a towel between your skin and the bag. ? Leave the ice on for 20 minutes, 2-3 times a day.  Move your fingers often to avoid stiffness and to lessen swelling.  Raise (elevate) the injured area above the level of your heart while you are sitting or lying down. General instructions  Take over-the-counter and prescription medicines only as told by your health care provider.  Do any exercise or physical therapy as told by your health care provider.  Do not drive or use heavy machinery while taking prescription pain medicine.  If you were given an elbow pad, wear it as told by your health care provider.  Keep all follow-up visits as told by your health care provider. This is important. Contact a health care provider if:  Your symptoms get worse.  Your symptoms do not get better with treatment.  You have new pain.  Your hand on the injured side feels numb or cold. Summary  Cubital tunnel syndrome is a condition that causes pain and weakness of the forearm and hand.  You are more likely to develop this condition if you do work or play sports that involve repeated arm movements.  This condition is often treated by stopping repetitive activities, applying ice, and using anti-inflammatory medicines.  In rare cases, surgery may be needed. This information is not intended to replace advice given to you by your health care provider. Make sure you discuss any questions you have with your health care provider. Document Revised: 08/26/2017 Document Reviewed: 08/26/2017 Elsevier Patient Education  2020 [employer].

## 2020-01-25 ENCOUNTER — Telehealth: Payer: Self-pay | Admitting: Neurology

## 2020-01-25 ENCOUNTER — Encounter: Payer: 59 | Admitting: Neurology

## 2020-01-25 NOTE — Telephone Encounter (Signed)
Let patient know I ordered his xray of the hand and he can call [facility] imaging to schedule or he can walk in I believe. I also referred him to Dr. Amanda Pea at [facility] for evaluation of Carpal Tunnel Syndrome. thanks

## 2020-01-25 NOTE — Procedures (Signed)
Full Name: Richard Valdez Gender: Male MRN #: [id number] Date of Birth: [birth year]    Visit Date: 01/21/2020 09:58 Age: 39 Years Examining Physician: Naomie Dean, MD  Referring Physician: Clare Gandy, MD Height: 5 feet 8 inch    History: Pain in hands. They feel like they go to sleep. Digits 1-3 with cramping bilaterally. He feels electric shocks radiating from the elbow to digits 4-5 bilaterally.   Summary: EMG/NCS was performed on the bilateral upper extremities. Evaluation of the left Ulnar ADM motor nerve showed decreased conduction velocity (A Elbow-B Elbow, 8m/s, N>49) with a 4m/s drop across the elbow section (N<65m/s).  Evaluation of the right Ulnar ADM motor nerve showed decreased conduction velocity (A Elbow-B Elbow, 16m/s, N>49) with a 53m/s drop across the elbow section (N<74m/s).  The right Ulnar 5th digit orthodromic sensory nerve showed reduced amplitude (6.7 V, N>10).  The right Median 2nd Digit orthodromic sensory nerve showed reduced amplitude(3uV, N>5)F Wave studies indicate that the right Ulnar F wave has delayed latency(32.8, N<73ms).The right median/ulnar (palm) comparison nerve showed prolonged distal peak latency (Median Palm, 2.3 ms, N<2.2) and abnormal peak latency difference (Median Palm-Ulnar Palm, 0.4 ms, N<0.4) with a relative median delay.  All remaining nerves (as indicated in the following tables) were within normal limits.  All muscles (as indicated in the following tables) were within normal limits.      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 There is concomitant right Carpal Tunnel syndrome. Given clinical symptoms there may be Carpal Tunnel Syndrome of the left wrist that is not appreciated on this study.   Naomie Dean M.D.  [facility] [address], [address] [place], [place] [address] Tel: [phone] Fax: [phone]  Verbal informed consent was obtained from the patient, patient was informed  of potential risk of procedure, including bruising, bleeding, hematoma formation, infection, muscle weakness, muscle pain, numbness, among others.        MNC    Nerve / Sites Muscle Latency Ref. Amplitude Ref. Rel Amp Segments Distance Velocity Ref. Area    ms ms mV mV %  cm m/s m/s mVms  R Median - APB     Wrist APB 3.9 ?4.4 6.8 ?4.0 100 Wrist - APB 7   20.5     Upper arm APB 7.9  7.1  105 Upper arm - Wrist 22 56 ?49 22.7  L Median - APB     Wrist APB 3.5 ?4.4 7.6 ?4.0 100 Wrist - APB 7   18.5     Upper arm APB 7.4  7.3  96 Upper arm - Wrist 22 56 ?49 17.4  R Ulnar - ADM     Wrist ADM 2.5 ?3.3 10.3 ?6.0 100 Wrist - ADM 7   23.9     B.Elbow ADM 6.4  9.5  92.3 B.Elbow - Wrist 21 53 ?49 24.7     A.Elbow ADM 9.9  8.6  90.3 A.Elbow - B.Elbow 10 29 ?49 22.7  L Ulnar - ADM     Wrist ADM 2.5 ?3.3 8.5 ?6.0 100 Wrist - ADM 7   20.9     B.Elbow ADM 6.3  7.5  87.2 B.Elbow - Wrist 20 53 ?49 20.0     A.Elbow ADM 9.7  6.0  79.9 A.Elbow - B.Elbow 10 29 ?49 21.4             SNC    Nerve / Sites Rec. Site Peak Lat Ref.  Amp Ref. Segments Distance Peak Diff Ref.    ms ms V V  cm ms ms  R Radial - Anatomical snuff box (Forearm)     Forearm Wrist 2.2 ?2.9 17 ?15 Forearm - Wrist 10    L Radial - Anatomical snuff box (Forearm)     Forearm Wrist 2.1 ?2.9 18 ?15 Forearm - Wrist 10    R Median, Ulnar - Transcarpal comparison     Median Palm Wrist 2.3 ?2.2 40 ?35 Median Palm - Wrist 8       Ulnar Palm Wrist 1.9 ?2.2 6 ?12 Ulnar Palm - Wrist 8          Median Palm - Ulnar Palm  0.4 ?0.4  L Median, Ulnar - Transcarpal comparison     Median Palm Wrist 1.9 ?2.2 59 ?35 Median Palm - Wrist 8       Ulnar Palm Wrist 2.1 ?2.2 18 ?12 Ulnar Palm - Wrist 8          Median Palm - Ulnar Palm  -0.2 ?0.4  R Median - Orthodromic (Dig II, Mid palm)     Dig II Wrist 3.2 ?3.4 7 ?10 Dig II - Wrist 13    L Median - Orthodromic (Dig II, Mid palm)     Dig II Wrist 2.9 ?3.4 12 ?10 Dig II - Wrist 13    R Ulnar -  Orthodromic, (Dig V, Mid palm)     Dig V Wrist 2.6 ?3.1 3 ?5 Dig V - Wrist 11    L Ulnar - Orthodromic, (Dig V, Mid palm)     Dig V Wrist 2.8 ?3.1 7 ?5 Dig V - Wrist 30                       F  Wave    Nerve F Lat Ref.   ms ms  R Ulnar - ADM 32.8 ?32.0  L Ulnar - ADM 31.5 ?32.0         EMG Summary Table    Spontaneous MUAP Recruitment  Muscle IA Fib PSW Fasc Other Amp Dur. Poly Pattern  Bilat. Deltoid Normal None None None _______ Normal Normal Normal Normal  Bilat. Triceps brachii Normal None None None _______ Normal Normal Normal Normal  Bilat. Biceps brachii Normal None None None _______ Normal Normal Normal Normal  Bilat. Flexor digitorum profundus (Ulnar) Normal None None None _______ Normal Normal Normal Normal  Bilat. Pronator teres Normal None None None _______ Normal Normal Normal Normal  Bilat. First dorsal interosseous Normal None None None _______ Normal Normal Normal Normal  Bilat. Opponens pollicis Normal None None None _______ Normal Normal Normal Normal  Bilat. Extensor indicis proprius Normal None None None _______ Normal Normal Normal Normal

## 2020-01-25 NOTE — Telephone Encounter (Signed)
Spoke with patient and provided the information from Dr Lucia Gaskins regarding orders for hand Xray and referral to Dr Amanda Pea. The pt verbalized understanding. He was given GI's phone number and is aware Dr Carlos Levering office will call him to schedule. Pt will call our office to follow up early next week if he has not heard from their office. Pt verbalized appreciation.

## 2020-01-25 NOTE — Addendum Note (Signed)
Addended by: Naomie Dean B on: 01/25/2020 02:36 PM   Modules accepted: Orders

## 2020-02-01 ENCOUNTER — Ambulatory Visit
Admission: RE | Admit: 2020-02-01 | Discharge: 2020-02-01 | Disposition: A | Discharge status: Home / Self Care | Payer: 59 | Source: Ambulatory Visit | Attending: Neurology | Admitting: Neurology

## 2020-02-01 ENCOUNTER — Other Ambulatory Visit: Payer: Self-pay

## 2020-02-01 ENCOUNTER — Telehealth: Payer: Self-pay | Admitting: Neurology

## 2020-02-01 ENCOUNTER — Telehealth: Payer: Self-pay | Admitting: Family Medicine

## 2020-02-01 DIAGNOSIS — G5610 Other lesions of median nerve, unspecified upper limb: Secondary | ICD-10-CM

## 2020-02-01 DIAGNOSIS — G5603 Carpal tunnel syndrome, bilateral upper limbs: Secondary | ICD-10-CM

## 2020-02-01 DIAGNOSIS — M79641 Pain in right hand: Secondary | ICD-10-CM

## 2020-02-01 MED ORDER — GABAPENTIN 600 MG PO TABS
600.0000 mg | ORAL_TABLET | Freq: Three times a day (TID) | ORAL | 1 refills | Status: DC
Start: 2020-02-01 — End: 2020-02-02

## 2020-02-01 NOTE — Telephone Encounter (Signed)
Patient calld states has had NCS & is waiting on Dr.Schmitz to call him w/ review f/u, in the mean time he is requesting refill on :  gabapentin (NEURONTIN) 600 MG tablet [[id number]]   Order Details Dose: 600 mg Route: Oral Frequency: 3 times daily  Dispense Quantity: 90 tablet Refills: 3       Sig: Take 1 tablet (600 mg total) by mouth 3 (three) times daily.      Start Date: 01/21/20 End Date: --  Written Date: 01/21/20 Expiration Date: 01/20/21  Providers  Authorizing Provider: Anson Fret, MD NPI: [id number]  DEA #: [id number]  Ordering User:  Anson Fret, MD    --Advise forwarding request to Dr.Schmitz for review & we will contact him pending dr's response.  --glh

## 2020-02-01 NOTE — Telephone Encounter (Signed)
Pt called, gabapentin (NEURONTIN) 600 MG tablet upset my stomach. Could you lower the dosage to 400 mg. Would like a call from the nurse.

## 2020-02-02 ENCOUNTER — Telehealth: Payer: Self-pay | Admitting: *Deleted

## 2020-02-02 ENCOUNTER — Other Ambulatory Visit: Payer: Self-pay | Admitting: Neurology

## 2020-02-02 MED ORDER — GABAPENTIN 400 MG PO CAPS: 400.0000 mg | ORAL_CAPSULE | Freq: Three times a day (TID) | ORAL | 1 refills | Status: DC | PRN

## 2020-02-02 NOTE — Telephone Encounter (Signed)
Prescription for 400 ordered thanks

## 2020-02-02 NOTE — Telephone Encounter (Signed)
Spoke with Richard Valdez and discussed the authorized change in dose to Gabapentin 400 mg instead of 600 mg. The patient verbalized understanding and appreciation. He asked if the pharmacy could change to [facility] on [address] [place]. He will let our office know if he does not see improvement in his GI symptoms.   I called [facility] on [address] and canceled the Gabapentin. I sent the script to [facility] in [place]. I called the Richard Valdez and LVM (ok per DPR) advising the pharmacy had been changed. Rx sent to [facility] in [place] for 400 mg three times daily as needed. Left office number for call back if he has any questions.

## 2020-02-02 NOTE — Telephone Encounter (Signed)
l called the pt and LVM (ok per DPR) advising Dr Lucia Gaskins reviewed left hand xray results. Report does not show arthritis. We do see the prior ulnar injury. I provided the office number in case pt has any questions. If he calls and did not get this message please provide the results to him as stated by Dr Lucia Gaskins and let us know if he has any questions.

## 2020-02-02 NOTE — Telephone Encounter (Signed)
-----   Message from Anson Fret, MD sent at 02/02/2020  9:22 AM EDT ----- No arthritis noted on report, prior ulnar injury noted thanks

## 2020-02-04 ENCOUNTER — Telehealth (INDEPENDENT_AMBULATORY_CARE_PROVIDER_SITE_OTHER): Payer: 59 | Admitting: Family Medicine

## 2020-02-04 DIAGNOSIS — G5620 Lesion of ulnar nerve, unspecified upper limb: Secondary | ICD-10-CM | POA: Diagnosis not present

## 2020-02-04 DIAGNOSIS — G5601 Carpal tunnel syndrome, right upper limb: Secondary | ICD-10-CM

## 2020-02-04 MED ORDER — GABAPENTIN 800 MG PO TABS: 800.0000 mg | ORAL_TABLET | Freq: Three times a day (TID) | ORAL | 1 refills | Status: DC

## 2020-02-04 NOTE — Assessment & Plan Note (Addendum)
EMG was revealing for ulnar neuropathy at the elbows as well as right-sided carpal tunnel.  He does get improvement with the gabapentin. -Counseled on supportive care. -We can try injections. -Gabapentin.  Counseled on its use

## 2020-02-04 NOTE — Assessment & Plan Note (Signed)
EMG revealing for right sided carpal tunnel  - consider brace - could consider injection

## 2020-02-04 NOTE — Progress Notes (Signed)
Virtual Visit via Telephone Note  I connected with Park Liter on 02/04/20 at  1:20 PM EDT by telephone and verified that I am speaking with the correct person using two identifiers.   I discussed the limitations, risks, security and privacy concerns of performing an evaluation and management service by telephone and the availability of in person appointments. I also discussed with the patient that there may be a patient responsible charge related to this service. The patient expressed understanding and agreed to proceed.  Patient: work  Physician: office   History of Present Illness:  Mr. Griffee is a 39 year old male following up after EMG of his bilateral upper extremities.  This was revealing for ulnar neuropathy at the elbow bilaterally as well as carpal tunnel on the right.   Observations/Objective:   Assessment and Plan:  Ulnar neuropathy of the elbows bilaterally: EMG was revealing for ulnar neuropathy at the elbows as well as right-sided carpal tunnel.  He does get improvement with the gabapentin. -Counseled on supportive care. -We can try injections. -Gabapentin.  Counseled on its use  Right-sided carpal tunnel: EMG revealing for right sided carpal tunnel  - consider brace - could consider injection   Follow Up Instructions:    I discussed the assessment and treatment plan with the patient. The patient was provided an opportunity to ask questions and all were answered. The patient agreed with the plan and demonstrated an understanding of the instructions.   The patient was advised to call back or seek an in-person evaluation if the symptoms worsen or if the condition fails to improve as anticipated.  I provided 6 minutes of non-face-to-face time during this encounter.   Clare Gandy, MD

## 2020-02-08 ENCOUNTER — Encounter: Payer: Self-pay | Admitting: Family Medicine

## 2020-02-08 ENCOUNTER — Ambulatory Visit: Payer: Self-pay

## 2020-02-08 ENCOUNTER — Other Ambulatory Visit: Payer: Self-pay

## 2020-02-08 ENCOUNTER — Telehealth: Payer: Self-pay | Admitting: Family Medicine

## 2020-02-08 ENCOUNTER — Ambulatory Visit (INDEPENDENT_AMBULATORY_CARE_PROVIDER_SITE_OTHER): Payer: 59 | Admitting: Family Medicine

## 2020-02-08 VITALS — BP 128/84 | HR 86 | Ht 68.0 in | Wt 200.0 lb

## 2020-02-08 DIAGNOSIS — G5620 Lesion of ulnar nerve, unspecified upper limb: Secondary | ICD-10-CM

## 2020-02-08 MED ORDER — METHYLPREDNISOLONE ACETATE 40 MG/ML IJ SUSP
40.0000 mg | Freq: Once | INTRAMUSCULAR | Status: AC
  Administered 2020-02-08: 40 mg via INTRA_ARTICULAR

## 2020-02-08 MED ORDER — GABAPENTIN 400 MG PO CAPS: 400.0000 mg | ORAL_CAPSULE | Freq: Three times a day (TID) | ORAL | 1 refills | Status: DC

## 2020-02-08 NOTE — Patient Instructions (Signed)
Good to see you Please try to keep your arms straight when you sleep   Please send me a message in MyChart with any questions or updates.  Please see me back in 4 weeks.   --Dr. Jordan Likes

## 2020-02-08 NOTE — Assessment & Plan Note (Signed)
More symptomatic in the left compared to the right however the right nerve is measured larger on ultrasound. -Counseled on home excise therapy and supportive care. -Provided lower dose of gabapentin as he was having some adverse effects with higher dose. -Injection bilaterally.

## 2020-02-08 NOTE — Telephone Encounter (Signed)
[facility]  [id number] - [place], [place] - [address] AT [facility] [address] Phone:  [phone]  Fax:  [phone]     Request Dr. Jordan Likes call them back they have questions re:   gabapentin (NEURONTIN) 400 MG capsule [[id number]]   Order Details Dose: 400 mg Route: Oral Frequency: 3 times daily  Dispense Quantity: 60 capsule Refills: 1       Sig: Take 1 capsule (400 mg total) by mouth 3 (three) times daily.      Start Date: 02/08/20 End Date: --   --forward message to Dr.Schmitz to call [phone]--glh

## 2020-02-08 NOTE — Progress Notes (Signed)
Richard Valdez - 39 y.o. male MRN [id number]  Date of birth: July 09, 1980  SUBJECTIVE:  Including CC & ROS.  Chief Complaint  Patient presents with  . Hand Pain    bilateral     Richard BLOYD is a 39 y.o. 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  His symptoms do get improvement with the gabapentin..   Review of Systems See HPI   HISTORY: Past Medical, Surgical, Social, and Family History Reviewed & Updated per EMR.   Pertinent Historical Findings include:  No past medical history on file.  Past Surgical History:  Procedure Laterality Date  . HERNIA REPAIR    . I & D EXTREMITY Right 10/29/2012   Procedure: Excisional DEBRIDEMENT with Extensor Tendon Repair Right Long and Ring Fingers;  Surgeon: [name], MD;  Location: [facility] OR;  Service: Orthopedics;  Laterality: Right;    Family History  Problem Relation Age of Onset  . Hypertension Mother   . Hypertension Father     Social History   Socioeconomic History  . Marital status: Single    Spouse name: Not on file  . Number of children: Not on file  . Years of education: Not on file  . Highest education level: Not on file  Occupational History  . Not on file  Tobacco Use  . Smoking status: Current Every Day Smoker    Packs/day: 2.50    Years: 18.00    Pack years: 45.00    Types: Cigarettes  . Smokeless tobacco: Never Used  . Tobacco comment: maybe 1ppd now as of 01/07/20  Vaping Use  . Vaping Use: Never used  Substance and Sexual Activity  . Alcohol use: Yes    Comment: "very seldom"  . Drug use: No  . Sexual activity: Yes  Other Topics Concern  . Not on file  Social History Narrative   Lives at home with his fiance   Right handed   Caffeine: about 48 oz of Mtn Dew per day   Social Determinants of Health   Financial Resource Strain:   . Difficulty of Paying Living Expenses: Not on file  Food Insecurity:   . Worried About Programme researcher, broadcasting/film/video in the Last Year: Not on file  .  Ran Out of Food in the Last Year: Not on file  Transportation Needs:   . Lack of Transportation (Medical): Not on file  . Lack of Transportation (Non-Medical): Not on file  Physical Activity:   . Days of Exercise per Week: Not on file  . Minutes of Exercise per Session: Not on file  Stress:   . Feeling of Stress : Not on file  Social Connections:   . Frequency of Communication with Friends and Family: Not on file  . Frequency of Social Gatherings with Friends and Family: Not on file  . Attends Religious Services: Not on file  . Active Member of Clubs or Organizations: Not on file  . Attends Banker Meetings: Not on file  . Marital Status: Not on file  Intimate Partner Violence:   . Fear of Current or Ex-Partner: Not on file  . Emotionally Abused: Not on file  . Physically Abused: Not on file  . Sexually Abused: Not on file     PHYSICAL EXAM:  VS: BP 128/84   Pulse 86   Ht 5\' 8"  (1.727 m)   Wt 200 lb (90.7 kg)   BMI 30.41 kg/m  Physical Exam Gen: NAD, alert,  cooperative with exam, well-appearing   Limited ultrasound: Right elbow, left elbow:  Right elbow: Ulnar nerve was observed in the cubital tunnel.  It was measured to be 0.15 cm.  Left elbow: Ulnar nerve was observed in the cubital tunnel.  Measured 0.08 cm.   Summary: Significant enlargement of the right ulnar nerve compared to the left  Ultrasound and interpretation by Clare Gandy, MD   Aspiration/Injection Procedure Note JIGAR ZIELKE 1980/11/25  Procedure: Injection Indications: Right ulnar nerve pain  Procedure Details Consent: Risks of procedure as well as the alternatives and risks of each were explained to the (patient/caregiver).  Consent for procedure obtained. Time Out: Verified patient identification, verified procedure, site/side was marked, verified correct patient position, special equipment/implants available, medications/allergies/relevent history reviewed, required  imaging and test results available.  Performed.  The area was cleaned with iodine and alcohol swabs.    The right ulnar nerve was injected using 1 cc's of 40 mg of Depo-Medrol and 1 cc's of 0.25% bupivacaine with a 25 1 1/2" needle.  Ultrasound was used.  The needle was observed proximal to the ulnar nerve itself.  Images were obtained in long views showing the injection.     A sterile dressing was applied.  Patient did tolerate procedure well.  Aspiration/Injection Procedure Note SHAWNDALE KILPATRICK December 11, 1980  Procedure: Injection Indications: Left ulnar nerve pain  Procedure Details Consent: Risks of procedure as well as the alternatives and risks of each were explained to the (patient/caregiver).  Consent for procedure obtained. Time Out: Verified patient identification, verified procedure, site/side was marked, verified correct patient position, special equipment/implants available, medications/allergies/relevent history reviewed, required imaging and test results available.  Performed.  The area was cleaned with iodine and alcohol swabs.    The left ulnar nerve was injected using 1 cc's of 40 mg of Depo-Medrol and 1 cc's of 0.25% bupivacaine with a 25 1 1/2" needle.  Ultrasound was used.  The needle was observed proximal to the ulnar nerve itself.  Images were obtained in long views showing the injection.     A sterile dressing was applied.  Patient did tolerate procedure well.    ASSESSMENT & PLAN:   Ulnar neuropathy at elbow More symptomatic in the left compared to the right however the right nerve is measured larger on ultrasound. -Counseled on home excise therapy and supportive care. -Provided lower dose of gabapentin as he was having some adverse effects with higher dose. -Injection bilaterally.

## 2020-02-09 NOTE — Telephone Encounter (Signed)
Called pharmacy, unable to reach jonathan.   Myra Rude, MD [facility] 02/09/2020, 8:26 AM

## 2020-02-19 NOTE — Telephone Encounter (Signed)
Pt has called to report that he left his gabapentin (NEURONTIN) 600 MG capsule in [place] and would like to know if Dr Lucia Gaskins can call in another prescription for his gabapentin to [facility] [phone]

## 2020-02-22 NOTE — Telephone Encounter (Signed)
I spoke with the patient. Dr Jordan Likes was the last provider to order Gabapentin 400 mg. The pt stated he had already filled it and didn't realize it had been prescribed. He verbalized appreciation for the call back.

## 2020-03-07 ENCOUNTER — Ambulatory Visit: Payer: 59 | Admitting: Family Medicine

## 2020-03-08 ENCOUNTER — Ambulatory Visit (INDEPENDENT_AMBULATORY_CARE_PROVIDER_SITE_OTHER): Payer: 59 | Admitting: Family Medicine

## 2020-03-08 ENCOUNTER — Other Ambulatory Visit: Payer: Self-pay

## 2020-03-08 ENCOUNTER — Encounter: Payer: Self-pay | Admitting: Family Medicine

## 2020-03-08 ENCOUNTER — Ambulatory Visit (HOSPITAL_BASED_OUTPATIENT_CLINIC_OR_DEPARTMENT_OTHER)
Admission: RE | Admit: 2020-03-08 | Discharge: 2020-03-08 | Disposition: A | Discharge status: Home / Self Care | Payer: 59 | Source: Ambulatory Visit | Attending: Family Medicine | Admitting: Family Medicine

## 2020-03-08 DIAGNOSIS — G5620 Lesion of ulnar nerve, unspecified upper limb: Secondary | ICD-10-CM | POA: Diagnosis not present

## 2020-03-08 DIAGNOSIS — M79642 Pain in left hand: Secondary | ICD-10-CM | POA: Diagnosis not present

## 2020-03-08 DIAGNOSIS — M79641 Pain in right hand: Secondary | ICD-10-CM | POA: Insufficient documentation

## 2020-03-08 MED ORDER — GABAPENTIN 600 MG PO TABS: 600.0000 mg | ORAL_TABLET | Freq: Three times a day (TID) | ORAL | 1 refills | Status: DC

## 2020-03-08 NOTE — Assessment & Plan Note (Addendum)
Did get some improvement with ulnar nerve injections but still having pain on the dorsal aspect of the hand. Is worse at the end of the day.  Concerned this is either stress fracture versus degenerative changes. -Counseled on home exercise therapy and supportive care. -X-ray right hand -MRI of the right and left hand to evaluate for fracture versus degenerative changes.

## 2020-03-08 NOTE — Assessment & Plan Note (Signed)
Did get improvement with injections. Still having pain on radial aspect of dorsum of the hands.  - refilled of gabapentin.

## 2020-03-08 NOTE — Patient Instructions (Signed)
Good to see you I will call with the results from today   Please send me a message in MyChart with any questions or updates.  We will setup a virtual visit once the MRI is resulted. .   --Dr. Jordan Likes

## 2020-03-08 NOTE — Progress Notes (Signed)
Richard Valdez - 39 y.o. male MRN [id number]  Date of birth: [birth year]  SUBJECTIVE:  Including CC & ROS.  Chief Complaint  Patient presents with  . Follow-up    bilateral elbow  . Hand Pain    Richard Valdez is a 39 y.o. male that is presenting with worsening of his bilateral hand pain.  The pain is occurring over the dorsum of the first 3 digits.  Is occurring bilaterally.  Is worse at the end of the day.  No specific injury.  Imaging of the left hand has been normal.  We have done ulnar nerve injections which do help the pain over the ulnar aspect of the wrist and hand.  We will try carpal tunnel injection the right with no improvement.  He does work with his hands.    Review of Systems See HPI   HISTORY: Past Medical, Surgical, Social, and Family History Reviewed & Updated per EMR.   Pertinent Historical Findings include:  No past medical history on file.  Past Surgical History:  Procedure Laterality Date  . HERNIA REPAIR    . I & D EXTREMITY Right 10/29/2012   Procedure: Excisional DEBRIDEMENT with Extensor Tendon Repair Right Long and Ring Fingers;  Surgeon: [name], MD;  Location: [facility] OR;  Service: Orthopedics;  Laterality: Right;    Family History  Problem Relation Age of Onset  . Hypertension Mother   . Hypertension Father     Social History   Socioeconomic History  . Marital status: Single    Spouse name: Not on file  . Number of children: Not on file  . Years of education: Not on file  . Highest education level: Not on file  Occupational History  . Not on file  Tobacco Use  . Smoking status: Current Every Day Smoker    Packs/day: 2.50    Years: 18.00    Pack years: 45.00    Types: Cigarettes  . Smokeless tobacco: Never Used  . Tobacco comment: maybe 1ppd now as of 01/07/20  Vaping Use  . Vaping Use: Never used  Substance and Sexual Activity  . Alcohol use: Yes    Comment: "very seldom"  . Drug use: No  . Sexual activity: Yes  Other Topics  Concern  . Not on file  Social History Narrative   Lives at home with his fiance   Right handed   Caffeine: about 48 oz of Mtn Dew per day   Social Determinants of Health   Financial Resource Strain:   . Difficulty of Paying Living Expenses: Not on file  Food Insecurity:   . Worried About Programme researcher, broadcasting/film/video in the Last Year: Not on file  . Ran Out of Food in the Last Year: Not on file  Transportation Needs:   . Lack of Transportation (Medical): Not on file  . Lack of Transportation (Non-Medical): Not on file  Physical Activity:   . Days of Exercise per Week: Not on file  . Minutes of Exercise per Session: Not on file  Stress:   . Feeling of Stress : Not on file  Social Connections:   . Frequency of Communication with Friends and Family: Not on file  . Frequency of Social Gatherings with Friends and Family: Not on file  . Attends Religious Services: Not on file  . Active Member of Clubs or Organizations: Not on file  . Attends Banker Meetings: Not on file  . Marital Status: Not on file  Intimate Partner Violence:   . Fear of Current or Ex-Partner: Not on file  . Emotionally Abused: Not on file  . Physically Abused: Not on file  . Sexually Abused: Not on file     PHYSICAL EXAM:  VS: BP 126/74   Pulse 98   Ht 5\' 9"  (1.753 m)   Wt 218 lb (98.9 kg)   BMI 32.19 kg/m  Physical Exam Gen: NAD, alert, cooperative with exam, well-appearing   ASSESSMENT & PLAN:   Bilateral hand pain Did get some improvement with ulnar nerve injections but still having pain on the dorsal aspect of the hand. Is worse at the end of the day.  Concerned this is either stress fracture versus degenerative changes. -Counseled on home exercise therapy and supportive care. -X-ray right hand -MRI of the right and left hand to evaluate for fracture versus degenerative changes.   Ulnar neuropathy at elbow Did get improvement with injections. Still having pain on radial aspect of dorsum  of the hands.  - refilled of gabapentin.

## 2020-03-09 ENCOUNTER — Telehealth: Payer: Self-pay | Admitting: Family Medicine

## 2020-03-09 NOTE — Telephone Encounter (Signed)
Informed of xray results.   Myra Rude, MD [facility] 03/09/2020, 2:33 PM

## 2020-03-14 ENCOUNTER — Other Ambulatory Visit: Payer: Self-pay | Admitting: Family Medicine

## 2020-03-14 ENCOUNTER — Telehealth: Payer: Self-pay | Admitting: Family Medicine

## 2020-03-14 NOTE — Telephone Encounter (Signed)
Patient called states gabapentin 600 MG is causing him to have severe diarrhea he wonders if they make it in a 400MG  tab--  Forwarding patient message to provider for medication change or reduction.  Please call pt if there are any questions.  -glh

## 2020-03-15 MED ORDER — GABAPENTIN 400 MG PO CAPS: 400.0000 mg | ORAL_CAPSULE | Freq: Three times a day (TID) | ORAL | 1 refills | Status: DC

## 2020-03-15 NOTE — Telephone Encounter (Signed)
Having loose stools with gabapentin 600 mg. He is out of the 400 mg. Will send the refill in.   Myra Rude, MD [facility] 03/15/2020, 9:48 AM

## 2020-03-27 ENCOUNTER — Ambulatory Visit
Admission: RE | Admit: 2020-03-27 | Discharge: 2020-03-27 | Disposition: A | Discharge status: Home / Self Care | Payer: 59 | Source: Ambulatory Visit | Attending: Family Medicine | Admitting: Family Medicine

## 2020-03-27 DIAGNOSIS — M79641 Pain in right hand: Secondary | ICD-10-CM

## 2020-03-30 ENCOUNTER — Telehealth (INDEPENDENT_AMBULATORY_CARE_PROVIDER_SITE_OTHER): Payer: 59 | Admitting: Family Medicine

## 2020-03-30 DIAGNOSIS — G5620 Lesion of ulnar nerve, unspecified upper limb: Secondary | ICD-10-CM | POA: Diagnosis not present

## 2020-03-30 MED ORDER — GABAPENTIN 400 MG PO CAPS: 400.0000 mg | ORAL_CAPSULE | Freq: Three times a day (TID) | ORAL | 1 refills | Status: DC

## 2020-03-30 NOTE — Assessment & Plan Note (Signed)
Pain is still ongoing and his MRI of his right and left hand was normal. Possible that his symptoms are still related to this ulnar neuropathy  - counseled on supportive care - gabapentin  - has an appt with Dr. Amanda Pea

## 2020-03-30 NOTE — Progress Notes (Signed)
Virtual Visit via Video Note  I connected with Richard Valdez on 03/30/20 at  2:40 PM EST by a video enabled telemedicine application and verified that I am speaking with the correct person using two identifiers.  Location: Patient: work Provider: home   I discussed the limitations of evaluation and management by telemedicine and the availability of in person appointments. The patient expressed understanding and agreed to proceed.  History of Present Illness:  [name] is a 39 yo M that is following up after an MRI of his right and left hand. This was normal. He is still having ongoing pain in his hands.    Observations/Objective:  Gen: NAD, alert, cooperative with exam, well-appearing   Assessment and Plan:  Ulnar neuropathy:  Pain is still ongoing and his MRI of his right and left hand was normal. Possible that his symptoms are still related to this ulnar neuropathy  - counseled on supportive care - gabapentin  - has an appt with Dr. Amanda Pea   Follow Up Instructions:    I discussed the assessment and treatment plan with the patient. The patient was provided an opportunity to ask questions and all were answered. The patient agreed with the plan and demonstrated an understanding of the instructions.   The patient was advised to call back or seek an in-person evaluation if the symptoms worsen or if the condition fails to improve as anticipated.   Clare Gandy, MD

## 2020-04-04 ENCOUNTER — Other Ambulatory Visit: Payer: Self-pay

## 2020-04-25 ENCOUNTER — Telehealth: Payer: Self-pay | Admitting: Family Medicine

## 2020-04-25 MED ORDER — GABAPENTIN 400 MG PO CAPS
400.0000 mg | ORAL_CAPSULE | Freq: Three times a day (TID) | ORAL | 1 refills | Status: DC
Start: 2020-04-25 — End: 2020-05-03

## 2020-04-25 NOTE — Telephone Encounter (Signed)
Patient called request Rx refill of :  gabapentin (NEURONTIN) 400 MG capsule [[id number]]   Order Details Dose: 400 mg Route: Oral Frequency: 3 times daily  Dispense Quantity: 60 capsule Refills: 1       Sig: Take 1 capsule (400 mg total) by mouth 3 (three) times daily.   --pt now wants Rx order sent to :   [facility] [address] [place] - [address] Phone:  [phone]  Fax:  [phone]     ---Forwarding request to provider.  -glh

## 2020-04-25 NOTE — Telephone Encounter (Signed)
Refilled gabapentin.   Myra Rude, MD [facility] 04/25/2020, 1:29 PM

## 2020-05-02 ENCOUNTER — Telehealth: Payer: Self-pay | Admitting: Family Medicine

## 2020-05-02 NOTE — Telephone Encounter (Signed)
Patient called states he needs more Rx (Gabapentin), says was told he could Double up on listed instruction for taking during Telehealth w/provide, but not enough Rx sent to pharmacy.  -Forwarding message to provider for  Chart / Rx review & poss addt'l Rx order-- Patient also ask if Dr.Schmitz would call him.  --glh

## 2020-05-03 MED ORDER — GABAPENTIN 400 MG PO CAPS
400.0000 mg | ORAL_CAPSULE | Freq: Three times a day (TID) | ORAL | 1 refills | Status: DC
Start: 2020-05-03 — End: 2020-05-11

## 2020-05-03 NOTE — Telephone Encounter (Signed)
Refilled gabapentin.   Myra Rude, MD [facility] 05/03/2020, 10:01 AM

## 2020-05-04 ENCOUNTER — Other Ambulatory Visit: Payer: Self-pay | Admitting: Family Medicine

## 2020-05-09 ENCOUNTER — Other Ambulatory Visit: Payer: Self-pay | Admitting: Family Medicine

## 2020-05-10 ENCOUNTER — Telehealth: Payer: Self-pay | Admitting: Family Medicine

## 2020-05-10 NOTE — Telephone Encounter (Signed)
Forwarding message to provider ---  Patient called again about 4:25 regarding gabapentin refill request. -glh

## 2020-05-11 MED ORDER — GABAPENTIN 400 MG PO CAPS
ORAL_CAPSULE | ORAL | 1 refills | Status: DC
Start: 2020-05-11 — End: 2020-05-29

## 2020-05-11 NOTE — Telephone Encounter (Signed)
Refilled gabapentin.   Myra Rude, MD [facility] 05/11/2020, 8:34 AM

## 2020-05-16 ENCOUNTER — Telehealth: Payer: Self-pay | Admitting: Family Medicine

## 2020-05-16 NOTE — Telephone Encounter (Signed)
1/23--[facility] cld left msg on office VM that pt back, says lost med just fill 05/11/20 ( 90dys supply of 400 MG Gabapentin Rx) he ws advised to contact Dr's office for any refill approval.  --Pt cld today 1/24/ says unfortunately he misplaced/lost Rx somewhere & ask if provider would send in another or call pharmacy to approve refill.  --forwarding request to provider.  -gabapentin (NEURONTIN) 400 MG capsule [[id number]]   Order Details Dose, Route, Frequency: As Directed  Dispense Quantity: 90 capsule Refills: 1       Sig: Take 2 pills three times a day as needed   ------ Pharmacy  [facility] [address], [place] - [address]  [address] [place] [address]  Phone:  [phone] Fax:  [phone]    --glh

## 2020-05-17 NOTE — Telephone Encounter (Signed)
Left VM for patient. If he calls back please have him speak with a nurse/CMA and inform I am unable to refill his lost prescription due to the amount of medication as it is unsafe at this time. .   If any questions then please take the best time and phone number to call and I will try to call him back.   Myra Rude, MD [facility] 05/17/2020, 7:56 AM

## 2020-05-27 ENCOUNTER — Other Ambulatory Visit: Payer: Self-pay | Admitting: Neurology

## 2020-06-21 ENCOUNTER — Other Ambulatory Visit: Payer: Self-pay | Admitting: *Deleted

## 2020-06-21 MED ORDER — GABAPENTIN 400 MG PO CAPS: ORAL_CAPSULE | ORAL | 0 refills | Status: DC

## 2020-09-15 ENCOUNTER — Encounter (HOSPITAL_COMMUNITY): Payer: Self-pay | Admitting: Orthopedic Surgery

## 2020-09-15 NOTE — Progress Notes (Signed)
EKG: na CXR: na ECHO: denies Stress Test: denies Cardiac Cath: denies  Fasting Blood Sugar- na Checks Blood Sugar__na_ times a day  OSA/CPAP: No  ASA/Blood Thinners: No  Covid test not needed  Anesthesia Review: No  Patient denies shortness of breath, fever, cough, and chest pain at PAT appointment.  Patient verbalized understanding of instructions provided today at the PAT appointment.  Patient asked to review instructions at home and day of surgery.   

## 2020-10-18 ENCOUNTER — Ambulatory Visit (HOSPITAL_COMMUNITY): Admission: RE | Admit: 2020-10-18 | Payer: 59 | Source: Ambulatory Visit | Admitting: Orthopedic Surgery

## 2020-10-18 HISTORY — DX: Other specified health status: Z78.9

## 2020-10-18 SURGERY — RELEASE, CARPAL TUNNEL AND CUBITAL TUNNEL
Anesthesia: General | Laterality: Left

## 2021-05-17 DIAGNOSIS — R69 Illness, unspecified: Secondary | ICD-10-CM | POA: Diagnosis not present

## 2021-05-25 DIAGNOSIS — G5603 Carpal tunnel syndrome, bilateral upper limbs: Secondary | ICD-10-CM | POA: Diagnosis not present

## 2021-05-25 DIAGNOSIS — G4700 Insomnia, unspecified: Secondary | ICD-10-CM | POA: Diagnosis not present

## 2021-05-25 DIAGNOSIS — R69 Illness, unspecified: Secondary | ICD-10-CM | POA: Diagnosis not present

## 2021-05-25 DIAGNOSIS — H1089 Other conjunctivitis: Secondary | ICD-10-CM | POA: Diagnosis not present

## 2021-06-01 DIAGNOSIS — G5603 Carpal tunnel syndrome, bilateral upper limbs: Secondary | ICD-10-CM | POA: Diagnosis not present

## 2021-06-01 DIAGNOSIS — G4700 Insomnia, unspecified: Secondary | ICD-10-CM | POA: Diagnosis not present

## 2021-06-01 DIAGNOSIS — H1089 Other conjunctivitis: Secondary | ICD-10-CM | POA: Diagnosis not present

## 2021-06-01 DIAGNOSIS — R69 Illness, unspecified: Secondary | ICD-10-CM | POA: Diagnosis not present

## 2021-06-08 DIAGNOSIS — H1089 Other conjunctivitis: Secondary | ICD-10-CM | POA: Diagnosis not present

## 2021-06-08 DIAGNOSIS — G5603 Carpal tunnel syndrome, bilateral upper limbs: Secondary | ICD-10-CM | POA: Diagnosis not present

## 2021-06-08 DIAGNOSIS — G4700 Insomnia, unspecified: Secondary | ICD-10-CM | POA: Diagnosis not present

## 2021-06-08 DIAGNOSIS — R69 Illness, unspecified: Secondary | ICD-10-CM | POA: Diagnosis not present

## 2021-06-27 DIAGNOSIS — F411 Generalized anxiety disorder: Secondary | ICD-10-CM | POA: Diagnosis not present

## 2021-06-27 DIAGNOSIS — R69 Illness, unspecified: Secondary | ICD-10-CM | POA: Diagnosis not present

## 2021-06-28 DIAGNOSIS — G4700 Insomnia, unspecified: Secondary | ICD-10-CM | POA: Diagnosis not present

## 2021-06-28 DIAGNOSIS — H1089 Other conjunctivitis: Secondary | ICD-10-CM | POA: Diagnosis not present

## 2021-06-28 DIAGNOSIS — G5603 Carpal tunnel syndrome, bilateral upper limbs: Secondary | ICD-10-CM | POA: Diagnosis not present

## 2021-06-28 DIAGNOSIS — R69 Illness, unspecified: Secondary | ICD-10-CM | POA: Diagnosis not present

## 2021-07-04 DIAGNOSIS — F411 Generalized anxiety disorder: Secondary | ICD-10-CM | POA: Diagnosis not present

## 2021-07-04 DIAGNOSIS — R69 Illness, unspecified: Secondary | ICD-10-CM | POA: Diagnosis not present

## 2021-07-04 DIAGNOSIS — F1490 Cocaine use, unspecified, uncomplicated: Secondary | ICD-10-CM | POA: Diagnosis not present

## 2021-07-07 DIAGNOSIS — H1089 Other conjunctivitis: Secondary | ICD-10-CM | POA: Diagnosis not present

## 2021-07-07 DIAGNOSIS — G5603 Carpal tunnel syndrome, bilateral upper limbs: Secondary | ICD-10-CM | POA: Diagnosis not present

## 2021-07-07 DIAGNOSIS — R69 Illness, unspecified: Secondary | ICD-10-CM | POA: Diagnosis not present

## 2021-07-07 DIAGNOSIS — G4700 Insomnia, unspecified: Secondary | ICD-10-CM | POA: Diagnosis not present

## 2021-07-13 DIAGNOSIS — R69 Illness, unspecified: Secondary | ICD-10-CM | POA: Diagnosis not present

## 2021-07-13 DIAGNOSIS — H1089 Other conjunctivitis: Secondary | ICD-10-CM | POA: Diagnosis not present

## 2021-07-13 DIAGNOSIS — G5603 Carpal tunnel syndrome, bilateral upper limbs: Secondary | ICD-10-CM | POA: Diagnosis not present

## 2021-07-13 DIAGNOSIS — G4700 Insomnia, unspecified: Secondary | ICD-10-CM | POA: Diagnosis not present

## 2021-07-21 DIAGNOSIS — R69 Illness, unspecified: Secondary | ICD-10-CM | POA: Diagnosis not present

## 2021-07-21 DIAGNOSIS — G5603 Carpal tunnel syndrome, bilateral upper limbs: Secondary | ICD-10-CM | POA: Diagnosis not present

## 2021-07-21 DIAGNOSIS — G4700 Insomnia, unspecified: Secondary | ICD-10-CM | POA: Diagnosis not present

## 2021-07-21 DIAGNOSIS — H1089 Other conjunctivitis: Secondary | ICD-10-CM | POA: Diagnosis not present

## 2021-07-28 DIAGNOSIS — G4700 Insomnia, unspecified: Secondary | ICD-10-CM | POA: Diagnosis not present

## 2021-07-28 DIAGNOSIS — F900 Attention-deficit hyperactivity disorder, predominantly inattentive type: Secondary | ICD-10-CM | POA: Diagnosis not present

## 2021-08-02 DIAGNOSIS — R69 Illness, unspecified: Secondary | ICD-10-CM | POA: Diagnosis not present

## 2021-08-02 DIAGNOSIS — H1089 Other conjunctivitis: Secondary | ICD-10-CM | POA: Diagnosis not present

## 2021-08-02 DIAGNOSIS — G4700 Insomnia, unspecified: Secondary | ICD-10-CM | POA: Diagnosis not present

## 2021-08-02 DIAGNOSIS — G5603 Carpal tunnel syndrome, bilateral upper limbs: Secondary | ICD-10-CM | POA: Diagnosis not present

## 2021-08-09 DIAGNOSIS — G4700 Insomnia, unspecified: Secondary | ICD-10-CM | POA: Diagnosis not present

## 2021-08-09 DIAGNOSIS — H1089 Other conjunctivitis: Secondary | ICD-10-CM | POA: Diagnosis not present

## 2021-08-09 DIAGNOSIS — R69 Illness, unspecified: Secondary | ICD-10-CM | POA: Diagnosis not present

## 2021-08-17 DIAGNOSIS — G4700 Insomnia, unspecified: Secondary | ICD-10-CM | POA: Diagnosis not present

## 2021-08-17 DIAGNOSIS — R69 Illness, unspecified: Secondary | ICD-10-CM | POA: Diagnosis not present

## 2021-08-17 DIAGNOSIS — H1089 Other conjunctivitis: Secondary | ICD-10-CM | POA: Diagnosis not present

## 2021-08-17 DIAGNOSIS — G5603 Carpal tunnel syndrome, bilateral upper limbs: Secondary | ICD-10-CM | POA: Diagnosis not present

## 2021-08-24 DIAGNOSIS — R69 Illness, unspecified: Secondary | ICD-10-CM | POA: Diagnosis not present

## 2021-08-24 DIAGNOSIS — G4700 Insomnia, unspecified: Secondary | ICD-10-CM | POA: Diagnosis not present

## 2021-08-24 DIAGNOSIS — H1089 Other conjunctivitis: Secondary | ICD-10-CM | POA: Diagnosis not present

## 2021-08-25 DIAGNOSIS — G4700 Insomnia, unspecified: Secondary | ICD-10-CM | POA: Diagnosis not present

## 2021-08-25 DIAGNOSIS — R69 Illness, unspecified: Secondary | ICD-10-CM | POA: Diagnosis not present

## 2021-08-25 DIAGNOSIS — G5603 Carpal tunnel syndrome, bilateral upper limbs: Secondary | ICD-10-CM | POA: Diagnosis not present

## 2021-08-25 DIAGNOSIS — H1089 Other conjunctivitis: Secondary | ICD-10-CM | POA: Diagnosis not present

## 2021-09-07 DIAGNOSIS — R69 Illness, unspecified: Secondary | ICD-10-CM | POA: Diagnosis not present

## 2021-09-07 DIAGNOSIS — G5603 Carpal tunnel syndrome, bilateral upper limbs: Secondary | ICD-10-CM | POA: Diagnosis not present

## 2021-09-07 DIAGNOSIS — G4700 Insomnia, unspecified: Secondary | ICD-10-CM | POA: Diagnosis not present

## 2022-06-10 ENCOUNTER — Emergency Department (HOSPITAL_BASED_OUTPATIENT_CLINIC_OR_DEPARTMENT_OTHER)
Admission: EM | Admit: 2022-06-10 | Discharge: 2022-06-10 | Disposition: A | Discharge status: Home / Self Care | Payer: Self-pay | Attending: Emergency Medicine | Admitting: Emergency Medicine

## 2022-06-10 ENCOUNTER — Emergency Department (HOSPITAL_BASED_OUTPATIENT_CLINIC_OR_DEPARTMENT_OTHER): Payer: Self-pay | Admitting: Radiology

## 2022-06-10 ENCOUNTER — Other Ambulatory Visit: Payer: Self-pay

## 2022-06-10 DIAGNOSIS — S2241XA Multiple fractures of ribs, right side, initial encounter for closed fracture: Secondary | ICD-10-CM | POA: Insufficient documentation

## 2022-06-10 DIAGNOSIS — W2209XA Striking against other stationary object, initial encounter: Secondary | ICD-10-CM | POA: Insufficient documentation

## 2022-06-10 MED ORDER — OXYCODONE-ACETAMINOPHEN 5-325 MG PO TABS: 1.0000 | ORAL_TABLET | Freq: Four times a day (QID) | ORAL | 0 refills | Status: AC | PRN

## 2022-06-10 NOTE — Discharge Instructions (Signed)
Take ibuprofen 600 mg every 6 hours as needed for pain.  Take Percocet as prescribed as needed for pain not relieved with ibuprofen.  Follow-up with primary doctor if not improving in the next week.
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
## 2022-08-06 ENCOUNTER — Encounter: Payer: Self-pay | Admitting: *Deleted

## 2022-10-04 ENCOUNTER — Ambulatory Visit
Admission: EM | Admit: 2022-10-04 | Discharge: 2022-10-04 | Disposition: A | Discharge status: Home / Self Care | Payer: Self-pay | Attending: Family Medicine | Admitting: Family Medicine

## 2022-10-04 ENCOUNTER — Ambulatory Visit: Payer: Self-pay

## 2022-10-04 DIAGNOSIS — S41112A Laceration without foreign body of left upper arm, initial encounter: Secondary | ICD-10-CM

## 2022-10-04 NOTE — Discharge Instructions (Signed)
2 times daily wash the cut with soapy water. Put new antibiotic ointment on and a bandage.  Get the stitches out in 7-10 days, and you can come back here (there will not be an extra charge). We are open 8-8 M-F and 8-4 on Saturday and Sunday.
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
## 2022-10-04 NOTE — ED Provider Notes (Signed)
[facility]    CSN: [id number] Arrival date & time: 10/04/22  1559      History   Chief Complaint Chief Complaint  Patient presents with   Laceration    HPI Treylen Gibbs is a 42 y.o. male.    Laceration  Here for a cut to his left upper arm  It happened this morning.  He bandaged it up and then it was hurting and he decided he better get it checked out.  He has not of any medication  He cut his arm on some glass   He states his last tetanus was in the last year when he had stepped on a nail.     History reviewed. No pertinent past medical history.  There are no problems to display for this patient.   History reviewed. No pertinent surgical history.     Home Medications    Prior to Admission medications   Not on File    Family History History reviewed. No pertinent family history.  Social History Social History   Tobacco Use   Smoking status: Former    Types: Cigarettes   Smokeless tobacco: Never     Allergies   Patient has no allergy information on record.   Review of Systems Review of Systems   Physical Exam Triage Vital Signs ED Triage Vitals [10/04/22 1625]  Enc Vitals Group     BP 128/82     Pulse Rate 96     Resp 18     Temp 97.9 F (36.6 C)     Temp Source Oral     SpO2 96 %     Weight      Height      Head Circumference      Peak Flow      Pain Score      Pain Loc      Pain Edu?      Excl. in GC?    No data found.  Updated Vital Signs BP 128/82 (BP Location: Left Arm)   Pulse 96   Temp 97.9 F (36.6 C) (Oral)   Resp 18   SpO2 96%   Visual Acuity Right Eye Distance:   Left Eye Distance:   Bilateral Distance:    Right Eye Near:   Left Eye Near:    Bilateral Near:     Physical Exam Vitals reviewed.  Constitutional:      General: He is not in acute distress.    Appearance: He is not ill-appearing, toxic-appearing or diaphoretic.  Skin:    Capillary Refill: Capillary refill takes less  than 2 seconds.     Coloration: Skin is not pale.     Comments: There is a laceration that is approximately 1 6 cm in length and a horizontal orientation on the distal portion of his left anterior upper arm.  There is 1 spot that is is little bit limited the bandage off, but otherwise it is not bleeding at the time of exam    Neurological:     General: No focal deficit present.     Mental Status: He is alert and oriented to person, place, and time.     Comments: Neurovascular is intact distally      UC Treatments / Results  Labs (all labs ordered are listed, but only abnormal results are displayed) Labs Reviewed - No data to display  EKG   Radiology No results found.  Procedures Procedures (including critical care time)  Medications Ordered in  UC Medications - No data to display  Initial Impression / Assessment and Plan / UC Course  I have reviewed the triage vital signs and the nursing notes.  Pertinent labs & imaging results that were available during my care of the patient were reviewed by me and considered in my medical decision making (see chart for details).        After verbal consent is given, 1% lidocaine plain is used for infiltration anesthesia with fairly good results.  In the middle of the procedure little bit more was placed in the lateral end of the laceration to achieve anesthesia.  Under clean conditions 4-0 nylon is used to place 8 sutures.  No complications.  EBL was 2 mL.  Dressing is applied and wound care is explained   will return here in 7 to 10 days for suture removal.  While I was doing his laceration repair, he asked about swelling in his legs, discoloration in his legs and some fatigue.  He does have a PCP; he just has not seen him in about 2 years.  Did ask him to make an appointment with them about these on chronic issues. Final Clinical Impressions(s) / UC Diagnoses   Final diagnoses:  Laceration of left upper extremity, initial  encounter     Discharge Instructions      2 times daily wash the cut with soapy water. Put new antibiotic ointment on and a bandage.  Get the stitches out in 7-10 days, and you can come back here (there will not be an extra charge). We are open 8-8 M-F and 8-4 on Saturday and Sunday.      ED Prescriptions   None    PDMP not reviewed this encounter.   Zenia Resides, MD 10/04/22 1729

## 2022-10-05 ENCOUNTER — Encounter (HOSPITAL_COMMUNITY): Payer: Self-pay | Admitting: Orthopedic Surgery

## 2022-11-30 ENCOUNTER — Ambulatory Visit (HOSPITAL_COMMUNITY): Payer: Self-pay

## 2023-01-01 ENCOUNTER — Telehealth: Payer: Self-pay | Admitting: Physician Assistant

## 2023-01-01 DIAGNOSIS — K047 Periapical abscess without sinus: Secondary | ICD-10-CM

## 2023-01-01 DIAGNOSIS — S0993XA Unspecified injury of face, initial encounter: Secondary | ICD-10-CM

## 2023-01-01 MED ORDER — NAPROXEN 500 MG PO TABS: 500.0000 mg | ORAL_TABLET | Freq: Two times a day (BID) | ORAL | 0 refills | Status: AC

## 2023-01-01 MED ORDER — AMOXICILLIN-POT CLAVULANATE 875-125 MG PO TABS: 1.0000 | ORAL_TABLET | Freq: Two times a day (BID) | ORAL | 0 refills | Status: AC

## 2023-01-01 NOTE — Progress Notes (Signed)

## 2023-01-01 NOTE — Progress Notes (Signed)
I have spent 5 minutes in review of e-visit questionnaire, review and updating patient chart, medical decision making and response to patient.   William Cody Martin, PA-C    

## 2023-08-15 ENCOUNTER — Emergency Department (HOSPITAL_COMMUNITY)
Admission: EM | Admit: 2023-08-15 | Discharge: 2023-08-16 | Discharge status: Against Medical Advice | Payer: Self-pay | Attending: Emergency Medicine | Admitting: Emergency Medicine

## 2023-08-15 ENCOUNTER — Encounter (HOSPITAL_COMMUNITY): Payer: Self-pay | Admitting: Emergency Medicine

## 2023-08-15 ENCOUNTER — Other Ambulatory Visit: Payer: Self-pay

## 2023-08-15 DIAGNOSIS — Z5321 Procedure and treatment not carried out due to patient leaving prior to being seen by health care provider: Secondary | ICD-10-CM | POA: Insufficient documentation

## 2023-08-15 DIAGNOSIS — R2243 Localized swelling, mass and lump, lower limb, bilateral: Secondary | ICD-10-CM | POA: Insufficient documentation

## 2023-08-15 NOTE — ED Triage Notes (Signed)
 PT reports bilateral leg swelling more on the right leg xmonths however the pain has increased in the past week.  Pt has good pulses on the lower legs.  "I just walk so much."

## 2023-08-16 NOTE — ED Notes (Signed)
 Pt not answering to multiple call for vitals in the lobby
